# Patient Record
Sex: Male | Born: 1990 | Marital: Single | Smoking: Never smoker
Health system: Southern US, Community
[De-identification: ages and names within clinical notes are randomized; demographics above are authoritative.]

---
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
## 2018-01-23 ENCOUNTER — Emergency Department (HOSPITAL_COMMUNITY): Payer: PRIVATE HEALTH INSURANCE

## 2018-01-23 ENCOUNTER — Emergency Department (HOSPITAL_COMMUNITY)
Admission: EM | Admit: 2018-01-23 | Discharge: 2018-01-24 | Disposition: A | Discharge status: Home / Self Care | Payer: PRIVATE HEALTH INSURANCE | Attending: Emergency Medicine | Admitting: Emergency Medicine

## 2018-01-23 ENCOUNTER — Encounter (HOSPITAL_COMMUNITY): Payer: Self-pay | Admitting: Emergency Medicine

## 2018-01-23 ENCOUNTER — Other Ambulatory Visit: Payer: Self-pay

## 2018-01-23 DIAGNOSIS — Z046 Encounter for general psychiatric examination, requested by authority: Secondary | ICD-10-CM | POA: Diagnosis present

## 2018-01-23 DIAGNOSIS — R454 Irritability and anger: Secondary | ICD-10-CM | POA: Insufficient documentation

## 2018-01-23 DIAGNOSIS — R451 Restlessness and agitation: Secondary | ICD-10-CM | POA: Diagnosis not present

## 2018-01-23 DIAGNOSIS — F23 Brief psychotic disorder: Secondary | ICD-10-CM | POA: Insufficient documentation

## 2018-01-23 LAB — COMPREHENSIVE METABOLIC PANEL
ALK PHOS: 43 U/L (ref 38–126)
ALT: 20 U/L (ref 0–44)
ANION GAP: 8 (ref 5–15)
AST: 21 U/L (ref 15–41)
Albumin: 4.5 g/dL (ref 3.5–5.0)
BUN: 18 mg/dL (ref 6–20)
CALCIUM: 9.4 mg/dL (ref 8.9–10.3)
CO2: 25 mmol/L (ref 22–32)
Chloride: 107 mmol/L (ref 98–111)
Creatinine, Ser: 1.27 mg/dL — ABNORMAL HIGH (ref 0.61–1.24)
GFR calc Af Amer: >60 mL/min (ref >60)
GFR calc non Af Amer: >60 mL/min (ref >60)
GLUCOSE: 105 mg/dL — AB (ref 70–99)
Potassium: 4.1 mmol/L (ref 3.5–5.1)
SODIUM: 140 mmol/L (ref 135–145)
Total Bilirubin: 1.1 mg/dL (ref 0.3–1.2)
Total Protein: 7.5 g/dL (ref 6.5–8.1)

## 2018-01-23 LAB — TSH: TSH: 2.287 u[IU]/mL (ref 0.350–4.500)

## 2018-01-23 LAB — CBC
HCT: 45.9 % (ref 39.0–52.0)
HEMOGLOBIN: 15.2 g/dL (ref 13.0–17.0)
MCH: 29.8 pg (ref 26.0–34.0)
MCHC: 33.1 g/dL (ref 30.0–36.0)
MCV: 90 fL (ref 78.0–100.0)
Platelets: 250 10*3/uL (ref 150–400)
RBC: 5.1 MIL/uL (ref 4.22–5.81)
RDW: 13 % (ref 11.5–15.5)
WBC: 11 10*3/uL — ABNORMAL HIGH (ref 4.0–10.5)

## 2018-01-23 LAB — ACETAMINOPHEN LEVEL

## 2018-01-23 LAB — ETHANOL: Alcohol, Ethyl (B): <10 mg/dL (ref <10)

## 2018-01-23 LAB — SALICYLATE LEVEL: Salicylate Lvl: <7 mg/dL (ref 2.8–30.0)

## 2018-01-23 MED ORDER — ACETAMINOPHEN 325 MG PO TABS: 650.0000 mg | ORAL_TABLET | Freq: Four times a day (QID) | ORAL | Status: DC | PRN

## 2018-01-23 MED ORDER — LORAZEPAM 2 MG/ML IJ SOLN: 1.0000 mg | INTRAMUSCULAR | Status: DC | PRN

## 2018-01-23 MED ORDER — LORAZEPAM 1 MG PO TABS
1.0000 mg | ORAL_TABLET | ORAL | Status: DC | PRN
  Administered 2018-01-23: 1 mg via ORAL
  Filled 2018-01-23: qty 1

## 2018-01-23 NOTE — ED Provider Notes (Signed)
Patient placed in Quick Look pathway, seen and evaluated   Chief Complaint: aggressive behavior  HPI:   27 year old male with no PMH presenting with aggressive behavior and worsening outbursts towards his family. Mother reports the patient has lost weight and has noticed his eyes appearing to be bulging. He has cuts on his bilateral wrists.  History is limited as the patient will not answer questions at this time. He is intermittently getting out of the chair and screaming at his mother asking to go home.  ROS: Aggression (one)  Physical Exam:   Gen: No distress  Neuro: Awake and Alert  Skin: Warm    Focused Exam: Cuts noted to the bilateral wrist.  Labile mood.  Appears agitated.  The patient was seen and evaluated by myself and Dr. Silverio LayYao, attending physician.  IVC paperwork was placed.  Initiation of care has begun. The patient has been counseled on the process, plan, and necessity for staying for the completion/evaluation, and the remainder of the medical screening examination    Barkley BoardsMcDonald, Luxe Cuadros A, PA-C 01/23/18 1752    Charlynne PanderYao, David Hsienta, MD 01/24/18 [phone]

## 2018-01-23 NOTE — ED Notes (Signed)
Mother left phone number for patients father Jesusita OkaDan [phone].
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
## 2018-01-23 NOTE — ED Notes (Addendum)
Pt talking to the dad stating he wants to get out of here and asking dad to get out of here, why can't you come to pick me up. Dad reforcing that he has to wait till tomorrow. RN told pt that he has to wait till the doctors come and talks to him in the morning. Pt understands and goes back to his room.
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
## 2018-01-23 NOTE — ED Notes (Signed)
Pt dinner tray ordered.

## 2018-01-23 NOTE — ED Notes (Signed)
Pt ambulated to restroom with out any problems. 

## 2018-01-23 NOTE — ED Notes (Signed)
Pt updated on how the process works and what will happen next. RN asks pt if he would like something to help him sleep tonight and pt states that he would take something that will help him sleep and calm down.
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
## 2018-01-23 NOTE — ED Notes (Addendum)
Pt states is he allowed to make phone calls, pt updated by RN that he can make two phone calls that are up to 5 minutes long per day. Pt placing a call at this time.

## 2018-01-24 ENCOUNTER — Encounter (HOSPITAL_COMMUNITY): Payer: Self-pay | Admitting: Registered Nurse

## 2018-01-24 ENCOUNTER — Other Ambulatory Visit: Payer: Self-pay

## 2018-01-24 LAB — RAPID URINE DRUG SCREEN, HOSP PERFORMED
Amphetamines: NOT DETECTED
Benzodiazepines: NOT DETECTED
COCAINE: NOT DETECTED
OPIATES: NOT DETECTED
TETRAHYDROCANNABINOL: POSITIVE — AB

## 2018-01-24 MED ORDER — LORAZEPAM 1 MG PO TABS: 1.0000 mg | ORAL_TABLET | Freq: Three times a day (TID) | ORAL | 0 refills | Status: DC | PRN

## 2018-01-24 MED ORDER — OLANZAPINE 5 MG PO TABS
5.0000 mg | ORAL_TABLET | Freq: Two times a day (BID) | ORAL | Status: DC
  Administered 2018-01-24: 5 mg via ORAL
  Filled 2018-01-24: qty 1

## 2018-01-24 NOTE — Progress Notes (Signed)
CSW asked to obtain collateral from pt's parents.  CSW spoke to patient's father, Darien Ramusdward Sylva, who reports that pt has had a history of increasing withdrawal from "the world", isolation, aggressive behavior since 4-5 years ago.  He is ot able to go to school or work because he can not concentrate.  Father stated that the "family is scared of him when he gets aggressive."  CSW explained that pt does not meet criteria and has been calm and cooperative for the past 24 hours.  CSW offered several suggestions to father, including limit setting that might include a requirement that he go to be seen by a psychiatrist on an outpatient basis, having patient arrested if he continues to act aggressively and/or is assaultive in the future.  Father very reluctant to have pt arrested and related that they have been attempting to get him to go to a psychiatrist "for years".  Father did agree to pick patient up but is not able to do so until later on this afternoon, after 6 PM when he gets out of work.   CSW fax[facility]  Timmothy EulerJean T. Kaylyn LimSutter, MSW, LCSWA Disposition Clinical Social Work [phone] (cell) [phone] (office)

## 2018-01-24 NOTE — ED Notes (Signed)
Deputy served [employer] papers - [facility] copy sent to [facility], and all 3 sets on clipboard.

## 2018-01-24 NOTE — ED Notes (Signed)
Pt aware waiting for his father to pick him up this evening. Voiced understanding.

## 2018-01-24 NOTE — ED Notes (Signed)
Pt's father's number - [phone]- [phone].
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
## 2018-01-24 NOTE — ED Notes (Signed)
Pt noted to be wearing burgundy paper scrubs and has slide shoes at bedside.

## 2018-01-24 NOTE — ED Notes (Signed)
[facility] Carney BernJean, [facility], pt's father advised will pick up pt after work - around 1800 this evening.

## 2018-01-24 NOTE — ED Notes (Signed)
Breakfast tray ordered 

## 2018-01-24 NOTE — ED Notes (Signed)
[facility] [facility] to send [employer] to re-serve [employer] papers as requested

## 2018-01-24 NOTE — ED Notes (Signed)
Telepsych being performed. 

## 2018-01-24 NOTE — ED Notes (Signed)
Patient was given a drink. 

## 2018-01-24 NOTE — Progress Notes (Signed)
Disposition CSW called parents per patient's verbal permission and parents' request.  Left HIPAA compliant voice mail.  Timmothy EulerJean T. Kaylyn LimSutter, MSW, LCSWA Disposition Clinical Social Work [phone] (cell) [phone] (office)

## 2018-01-24 NOTE — ED Notes (Signed)
Rescinded IVC paperwork faxed and received at 1930 by [facility],RN

## 2018-01-24 NOTE — ED Notes (Signed)
Regular Diet was ordered for lunch.

## 2018-01-24 NOTE — ED Notes (Signed)
Pt noted to be laughing inappropriately at times - is currently sleeping. Lunch tray on bedside table.

## 2018-01-24 NOTE — ED Notes (Signed)
Pt ate 100% of breakfast - [facility] advised will be performing Telepsych soon.

## 2018-01-24 NOTE — ED Notes (Signed)
Regular diet ordered for dinner 

## 2018-01-24 NOTE — ED Notes (Signed)
Pt awake now - aware his mother called checking on him - pt gave verbal permission to give mother info. Mother had [facility] and pt's father - [phone] - prior to disposition.

## 2018-09-01 DIAGNOSIS — F203 Undifferentiated schizophrenia: Secondary | ICD-10-CM | POA: Diagnosis present

## 2019-08-04 ENCOUNTER — Encounter (HOSPITAL_COMMUNITY): Payer: Self-pay | Admitting: Emergency Medicine

## 2019-08-04 ENCOUNTER — Emergency Department (HOSPITAL_COMMUNITY)
Admission: EM | Admit: 2019-08-04 | Discharge: 2019-08-05 | Disposition: A | Discharge status: Home / Self Care | Payer: PRIVATE HEALTH INSURANCE | Attending: Emergency Medicine | Admitting: Emergency Medicine

## 2019-08-04 ENCOUNTER — Ambulatory Visit (HOSPITAL_COMMUNITY)
Admission: RE | Admit: 2019-08-04 | Discharge: 2019-08-04 | Disposition: A | Discharge status: Home / Self Care | Payer: Self-pay | Attending: Psychiatry | Admitting: Psychiatry

## 2019-08-04 ENCOUNTER — Other Ambulatory Visit: Payer: Self-pay

## 2019-08-04 DIAGNOSIS — R454 Irritability and anger: Secondary | ICD-10-CM | POA: Insufficient documentation

## 2019-08-04 DIAGNOSIS — Z915 Personal history of self-harm: Secondary | ICD-10-CM | POA: Insufficient documentation

## 2019-08-04 DIAGNOSIS — F12959 Cannabis use, unspecified with psychotic disorder, unspecified: Secondary | ICD-10-CM | POA: Insufficient documentation

## 2019-08-04 DIAGNOSIS — Z20822 Contact with and (suspected) exposure to covid-19: Secondary | ICD-10-CM | POA: Insufficient documentation

## 2019-08-04 DIAGNOSIS — F29 Unspecified psychosis not due to a substance or known physiological condition: Secondary | ICD-10-CM | POA: Insufficient documentation

## 2019-08-04 DIAGNOSIS — F2081 Schizophreniform disorder: Secondary | ICD-10-CM | POA: Insufficient documentation

## 2019-08-04 DIAGNOSIS — R443 Hallucinations, unspecified: Secondary | ICD-10-CM | POA: Insufficient documentation

## 2019-08-04 LAB — CBC
HCT: 44 % (ref 39.0–52.0)
Hemoglobin: 14.8 g/dL (ref 13.0–17.0)
MCH: 30.6 pg (ref 26.0–34.0)
MCHC: 33.6 g/dL (ref 30.0–36.0)
MCV: 90.9 fL (ref 80.0–100.0)
Platelets: 242 10*3/uL (ref 150–400)
RBC: 4.84 MIL/uL (ref 4.22–5.81)
RDW: 13.1 % (ref 11.5–15.5)
WBC: 9.5 10*3/uL (ref 4.0–10.5)
nRBC: 0 % (ref 0.0–0.2)

## 2019-08-04 LAB — COMPREHENSIVE METABOLIC PANEL
ALT: 17 U/L (ref 0–44)
AST: 18 U/L (ref 15–41)
Albumin: 4.6 g/dL (ref 3.5–5.0)
Alkaline Phosphatase: 42 U/L (ref 38–126)
Anion gap: 7 (ref 5–15)
BUN: 18 mg/dL (ref 6–20)
CO2: 24 mmol/L (ref 22–32)
Calcium: 8.9 mg/dL (ref 8.9–10.3)
Chloride: 108 mmol/L (ref 98–111)
Creatinine, Ser: 1.23 mg/dL (ref 0.61–1.24)
GFR calc Af Amer: >60 mL/min (ref >60)
GFR calc non Af Amer: >60 mL/min (ref >60)
Glucose, Bld: 103 mg/dL — ABNORMAL HIGH (ref 70–99)
Potassium: 3.9 mmol/L (ref 3.5–5.1)
Sodium: 139 mmol/L (ref 135–145)
Total Bilirubin: 1.1 mg/dL (ref 0.3–1.2)
Total Protein: 7.4 g/dL (ref 6.5–8.1)

## 2019-08-04 LAB — SALICYLATE LEVEL: Salicylate Lvl: <7 mg/dL — ABNORMAL LOW (ref 7.0–30.0)

## 2019-08-04 LAB — ETHANOL: Alcohol, Ethyl (B): <10 mg/dL (ref <10)

## 2019-08-04 LAB — ACETAMINOPHEN LEVEL: Acetaminophen (Tylenol), Serum: <10 ug/mL — ABNORMAL LOW (ref 10–30)
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
## 2019-08-04 NOTE — BH Assessment (Signed)
[facility] Assessment Progress Note  Case was staffed with Anselm Jungling FNP who recommended patient be observed and monitored. Patient will be transported to [facility] for medical clearance. Observation nurse to contact law enforcement to transport as [facility] charge nurse will be notified.
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
## 2019-08-05 DIAGNOSIS — F2081 Schizophreniform disorder: Secondary | ICD-10-CM

## 2019-08-05 DIAGNOSIS — F12959 Cannabis use, unspecified with psychotic disorder, unspecified: Secondary | ICD-10-CM | POA: Diagnosis present

## 2019-08-05 LAB — RAPID URINE DRUG SCREEN, HOSP PERFORMED
Amphetamines: NOT DETECTED
Barbiturates: NOT DETECTED
Benzodiazepines: NOT DETECTED
Cocaine: NOT DETECTED
Opiates: NOT DETECTED
Tetrahydrocannabinol: POSITIVE — AB

## 2019-08-05 LAB — SARS CORONAVIRUS 2 (TAT 6-24 HRS): SARS Coronavirus 2: NEGATIVE

## 2019-08-05 NOTE — ED Provider Notes (Signed)
Emergency Medicine Observation Re-evaluation Note  Nathan Mayo is a 29 y.o. male, seen on rounds today.  Pt initially presented to the ED for complaints of IVC and Hallucinations Currently, the patient is stable.  Physical Exam  BP 108/71 (BP Location: Right Arm)   Pulse (!) 56   Temp 97.8 F (36.6 C) (Oral)   Resp 18   Ht 6' (1.829 m)   Wt 72.6 kg   SpO2 100%   BMI 21.70 kg/m  Physical Exam  ED Course / MDM  EKG:    I have reviewed the labs performed to date as well as medications administered while in observation.  Recent changes in the last 24 hours include TTS consult. Plan  Current plan is for for discharge. Patient is under full IVC at this time. Will rescind   Terrilee Files, MD 08/05/19 1054
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
## 2019-11-30 IMAGING — CT CT HEAD W/O CM
3 of 4 series · 16 of 47 positions shown, 19 images · non-contrast
Comparison: None.

CLINICAL DATA: Agitation, aggressive behavior and self injury.

EXAM:
CT HEAD WITHOUT CONTRAST
TECHNIQUE: Contiguous axial images were obtained from the base of the skull
through the vertex without intravenous contrast.

[Series 4: head 2.0 h70h · axial · 0.42mm/px · z∈[-84,+66]mm · 10 of 85 slices shown, 13 images]
[im 5/85  brain]
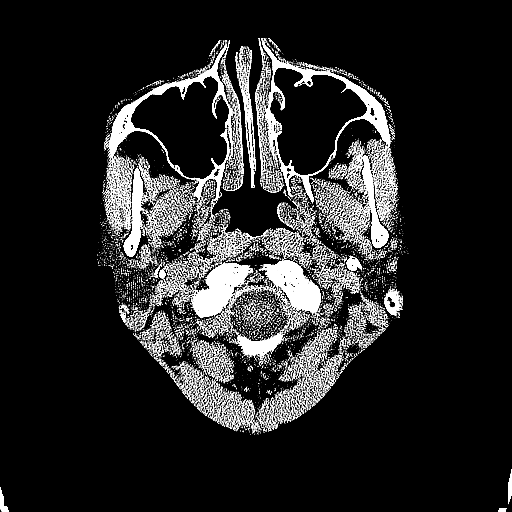
[im 5/85  bone]
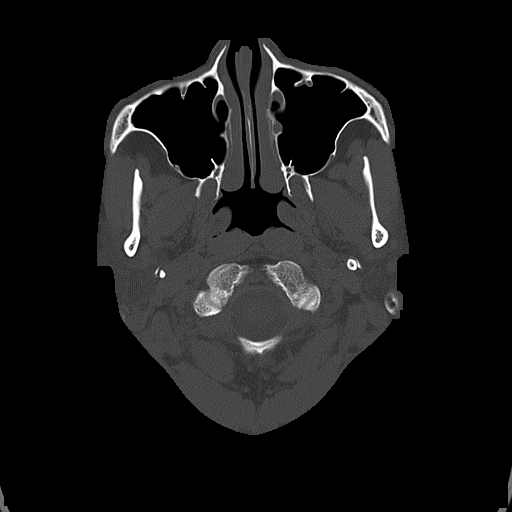
[im 13/85  brain]
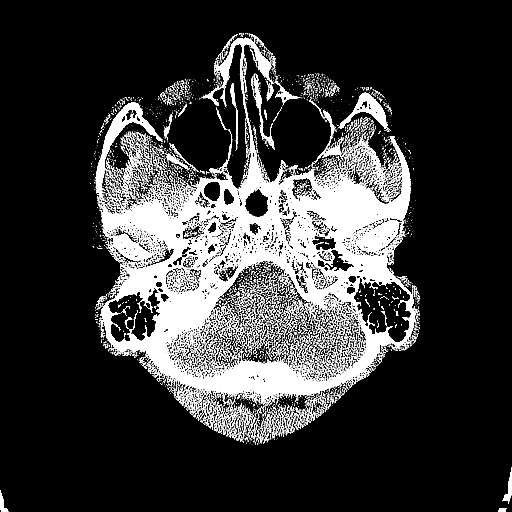
[im 22/85  brain]
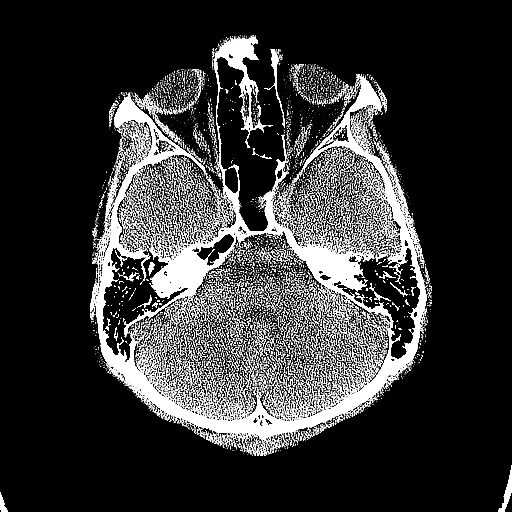
[im 30/85  brain]
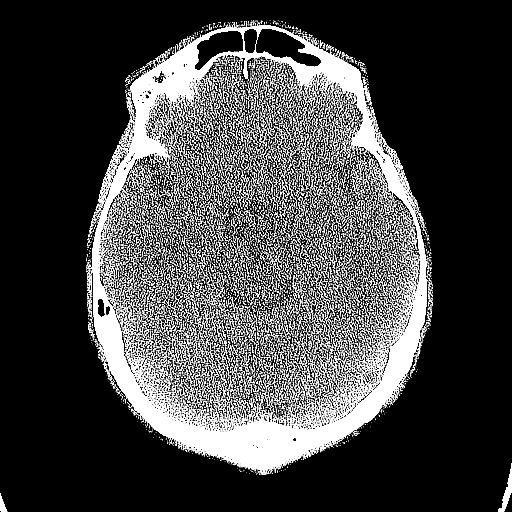
[im 38/85  brain]
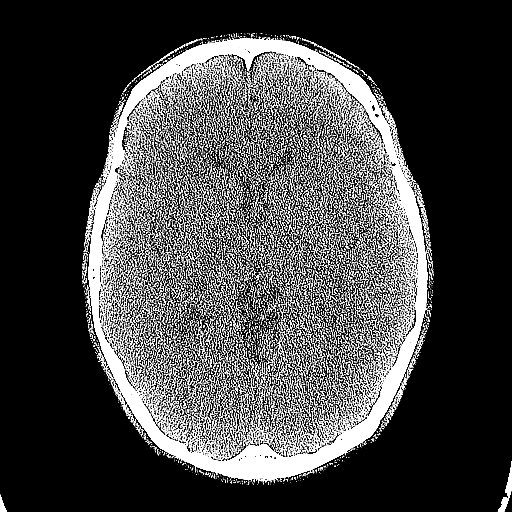
[im 38/85  bone]
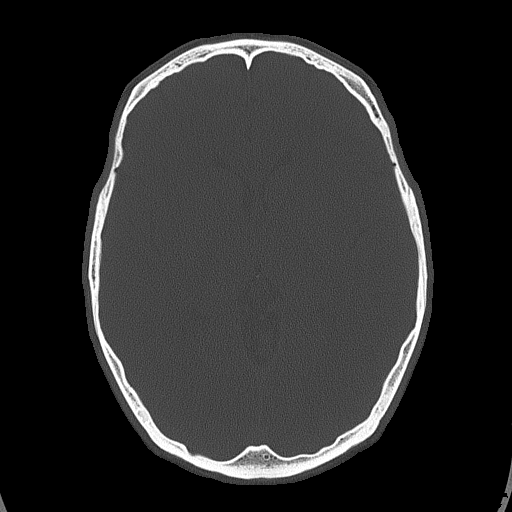
[im 47/85  brain]
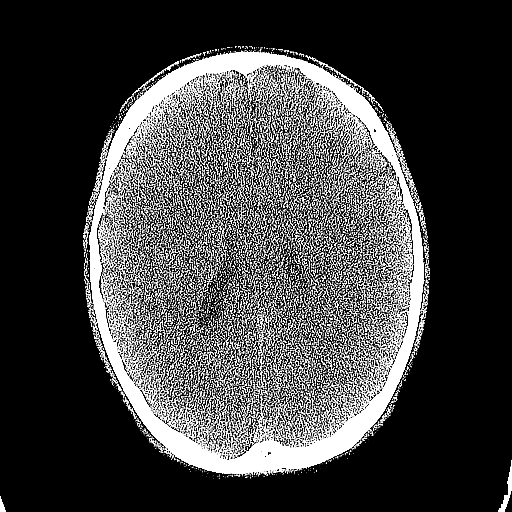
[im 55/85  brain]
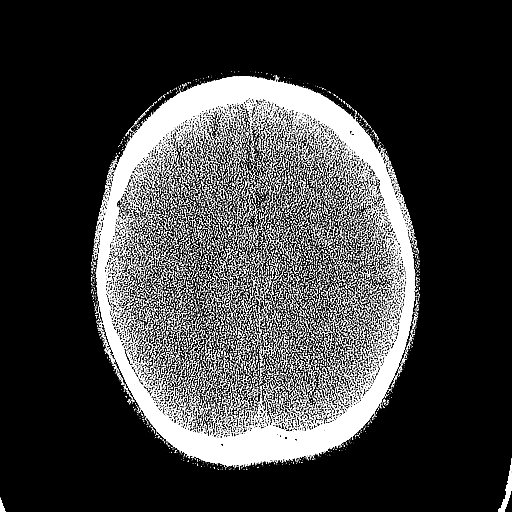
[im 64/85  brain]
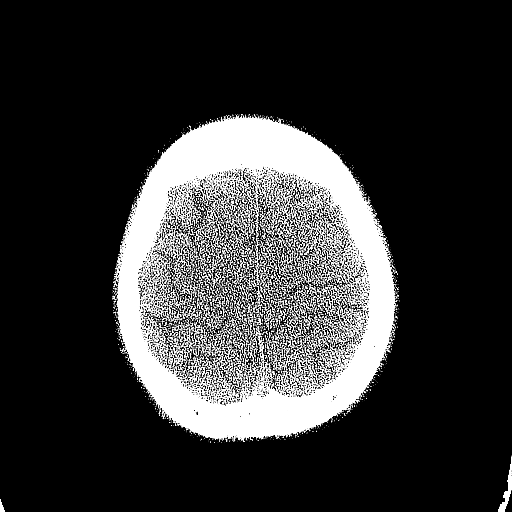
[im 72/85  brain]
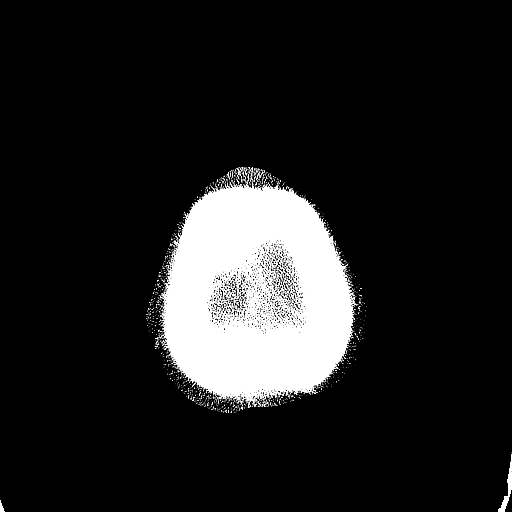
[im 72/85  bone]
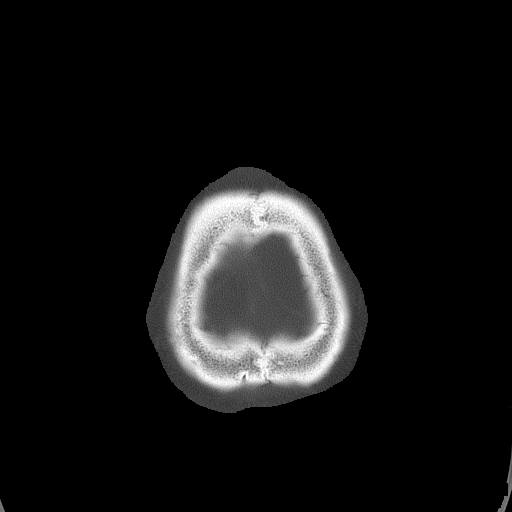
[im 80/85  brain]
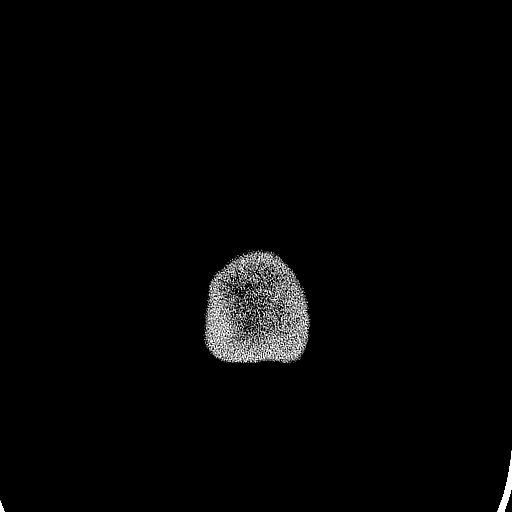

[Series 5: head 3.0 mpr cor · coronal · 0.33mm/px · 3 of 73 slices shown]
[im 25/73  brain]
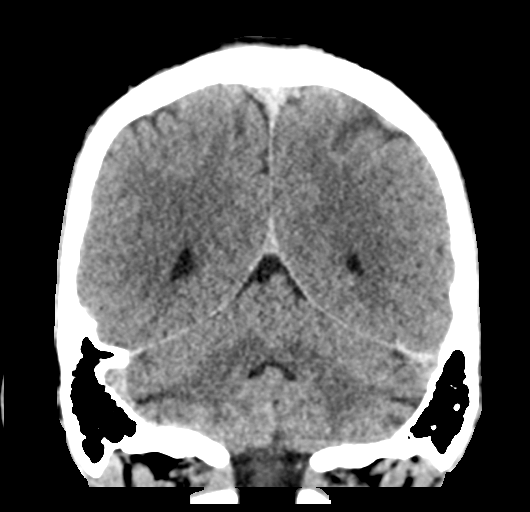
[im 33/73  brain]
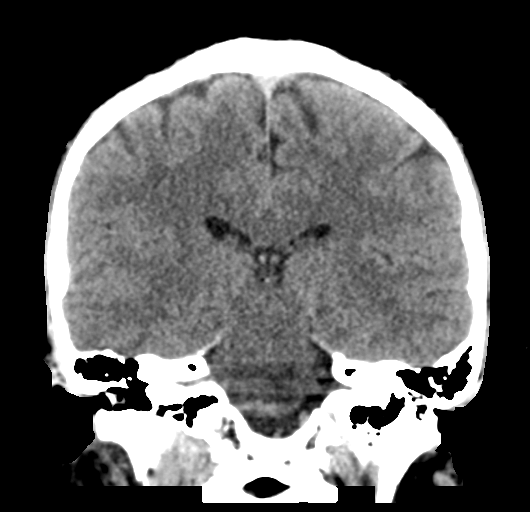
[im 41/73  brain]
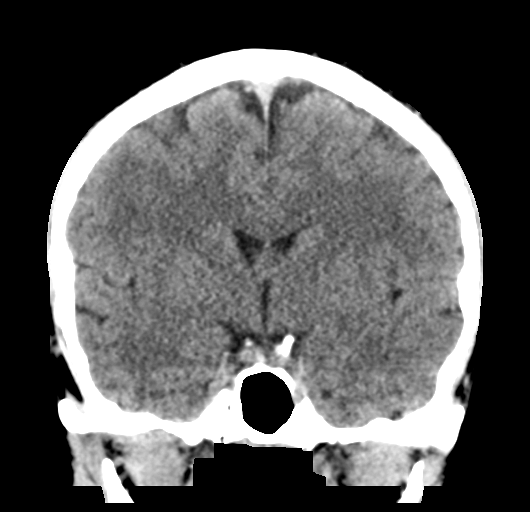

[Series 6: head 3.0 mpr sag · sagittal · 0.33mm/px · 3 of 66 slices shown]
[im 22/66  brain]
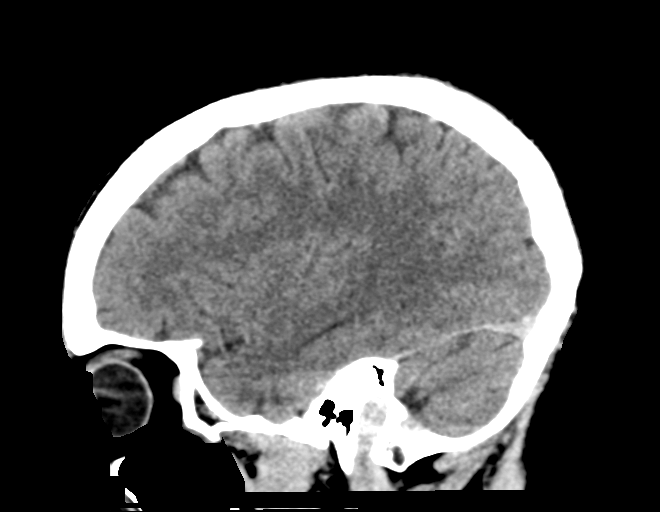
[im 33/66  brain]
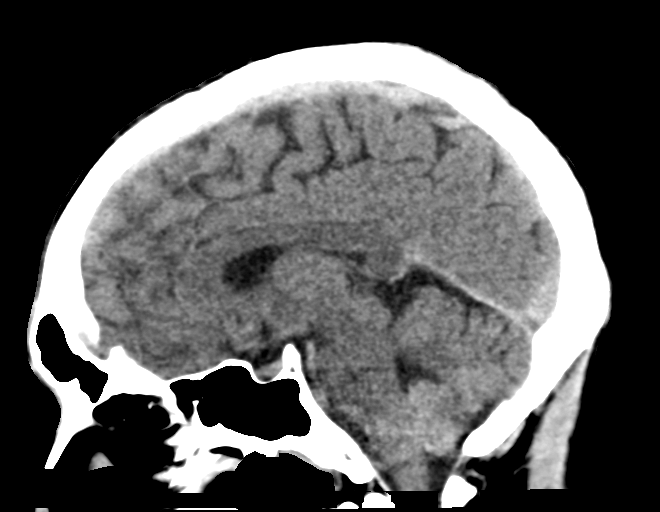
[im 44/66  brain]
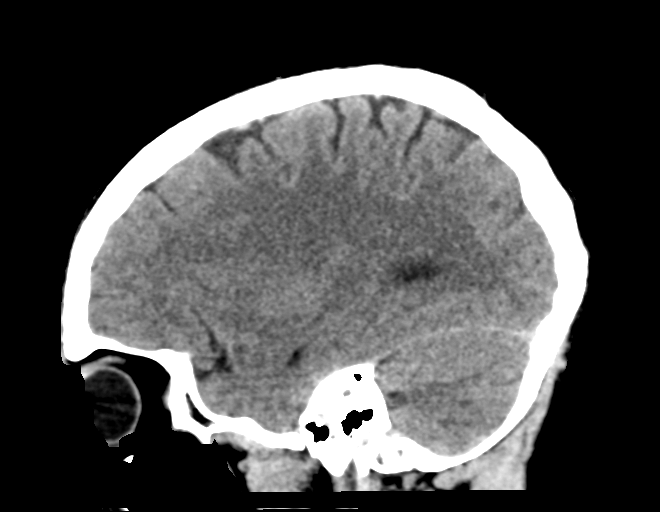

[16 of 47 positions shown; findings below may reference images not displayed]

FINDINGS: Brain: No evidence of acute infarction, hemorrhage, hydrocephalus,
extra-axial collection or mass lesion/mass effect.

Vascular: No hyperdense vessel or unexpected calcification.

Skull: Normal. Negative for fracture or focal lesion.

Sinuses/Orbits: No acute finding.

Other: None.
IMPRESSION: Normal head CT.

## 2021-11-28 ENCOUNTER — Other Ambulatory Visit: Payer: Self-pay

## 2021-11-28 ENCOUNTER — Emergency Department (HOSPITAL_COMMUNITY)
Admission: EM | Admit: 2021-11-28 | Discharge: 2021-12-09 | Disposition: A | Discharge status: Other Acute Inpatient Hospital | Payer: Self-pay | Attending: Student | Admitting: Student

## 2021-11-28 ENCOUNTER — Encounter (HOSPITAL_COMMUNITY): Payer: Self-pay | Admitting: *Deleted

## 2021-11-28 DIAGNOSIS — Y900 Blood alcohol level of less than 20 mg/100 ml: Secondary | ICD-10-CM | POA: Insufficient documentation

## 2021-11-28 DIAGNOSIS — Z20822 Contact with and (suspected) exposure to covid-19: Secondary | ICD-10-CM | POA: Insufficient documentation

## 2021-11-28 DIAGNOSIS — R45850 Homicidal ideations: Secondary | ICD-10-CM | POA: Insufficient documentation

## 2021-11-28 DIAGNOSIS — F2081 Schizophreniform disorder: Secondary | ICD-10-CM | POA: Diagnosis present

## 2021-11-28 DIAGNOSIS — R443 Hallucinations, unspecified: Secondary | ICD-10-CM

## 2021-11-28 DIAGNOSIS — E876 Hypokalemia: Secondary | ICD-10-CM | POA: Insufficient documentation

## 2021-11-28 DIAGNOSIS — F29 Unspecified psychosis not due to a substance or known physiological condition: Secondary | ICD-10-CM | POA: Insufficient documentation

## 2021-11-28 LAB — CBC WITH DIFFERENTIAL/PLATELET
Abs Immature Granulocytes: 0.02 10*3/uL (ref 0.00–0.07)
Basophils Absolute: 0 10*3/uL (ref 0.0–0.1)
Basophils Relative: 0 %
Eosinophils Absolute: 0.1 10*3/uL (ref 0.0–0.5)
Eosinophils Relative: 1 %
HCT: 45.7 % (ref 39.0–52.0)
Hemoglobin: 15.8 g/dL (ref 13.0–17.0)
Immature Granulocytes: 0 %
Lymphocytes Relative: 15 %
Lymphs Abs: 1.5 10*3/uL (ref 0.7–4.0)
MCH: 30.7 pg (ref 26.0–34.0)
MCHC: 34.6 g/dL (ref 30.0–36.0)
MCV: 88.7 fL (ref 80.0–100.0)
Monocytes Absolute: 0.6 10*3/uL (ref 0.1–1.0)
Monocytes Relative: 6 %
Neutro Abs: 7.7 10*3/uL (ref 1.7–7.7)
Neutrophils Relative %: 78 %
Platelets: 279 10*3/uL (ref 150–400)
RBC: 5.15 MIL/uL (ref 4.22–5.81)
RDW: 12.6 % (ref 11.5–15.5)
WBC: 9.9 10*3/uL (ref 4.0–10.5)
nRBC: 0 % (ref 0.0–0.2)

## 2021-11-28 LAB — COMPREHENSIVE METABOLIC PANEL
ALT: 15 U/L (ref 0–44)
AST: 14 U/L — ABNORMAL LOW (ref 15–41)
Albumin: 4.8 g/dL (ref 3.5–5.0)
Alkaline Phosphatase: 46 U/L (ref 38–126)
Anion gap: 6 (ref 5–15)
BUN: 16 mg/dL (ref 6–20)
CO2: 24 mmol/L (ref 22–32)
Calcium: 9.1 mg/dL (ref 8.9–10.3)
Chloride: 108 mmol/L (ref 98–111)
Creatinine, Ser: 1.16 mg/dL (ref 0.61–1.24)
GFR, Estimated: >60 mL/min (ref >60)
Glucose, Bld: 130 mg/dL — ABNORMAL HIGH (ref 70–99)
Potassium: 3.3 mmol/L — ABNORMAL LOW (ref 3.5–5.1)
Sodium: 138 mmol/L (ref 135–145)
Total Bilirubin: 1.3 mg/dL — ABNORMAL HIGH (ref 0.3–1.2)
Total Protein: 8 g/dL (ref 6.5–8.1)

## 2021-11-28 LAB — SALICYLATE LEVEL: Salicylate Lvl: <7 mg/dL — ABNORMAL LOW (ref 7.0–30.0)

## 2021-11-28 LAB — ETHANOL: Alcohol, Ethyl (B): <10 mg/dL (ref <10)

## 2021-11-28 LAB — ACETAMINOPHEN LEVEL: Acetaminophen (Tylenol), Serum: <10 ug/mL — ABNORMAL LOW (ref 10–30)
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
## 2021-11-28 NOTE — BH Assessment (Addendum)
This writer attempted to assess patient unsuccessfully due to problems with tele-med cart volume. Cart was re-booted and nurse who was assisting stated volume on cart is at 100 percent. Patient still could not hear this Clinical research associate. Volume was working correctly on the desk top at this location. IT was contacted at 1630 and ticket has been put in as a priority.   Number: [id number] Description: Volume unavailable on a specific workstation Opened: 2021-11-28 16:33:39 EDT
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
## 2021-11-28 NOTE — ED Notes (Signed)
Patient changed into paper scrubs and wanded by security. One labeled patient belongings bag secured in triage nurse's station.

## 2021-11-28 NOTE — ED Notes (Signed)
TTS at bedside. 

## 2021-11-29 DIAGNOSIS — F29 Unspecified psychosis not due to a substance or known physiological condition: Secondary | ICD-10-CM | POA: Diagnosis present

## 2021-11-29 LAB — RESP PANEL BY RT-PCR (FLU A&B, COVID) ARPGX2
Influenza A by PCR: NEGATIVE
Influenza B by PCR: NEGATIVE
SARS Coronavirus 2 by RT PCR: NEGATIVE

## 2021-11-29 LAB — RAPID URINE DRUG SCREEN, HOSP PERFORMED
Amphetamines: NOT DETECTED
Barbiturates: NOT DETECTED
Benzodiazepines: NOT DETECTED
Cocaine: NOT DETECTED
Opiates: NOT DETECTED
Tetrahydrocannabinol: POSITIVE — AB

## 2021-11-29 MED ORDER — OLANZAPINE 10 MG PO TBDP
10.0000 mg | ORAL_TABLET | Freq: Every day | ORAL | Status: DC
Start: 2021-11-29 — End: 2021-11-30
  Administered 2021-11-29: 10 mg via ORAL
  Filled 2021-11-29 (×2): qty 1

## 2021-11-29 MED ORDER — ZIPRASIDONE MESYLATE 20 MG IM SOLR
20.0000 mg | INTRAMUSCULAR | Status: DC | PRN
  Filled 2021-11-29: qty 20

## 2021-11-29 MED ORDER — OLANZAPINE 10 MG PO TBDP
10.0000 mg | ORAL_TABLET | Freq: Three times a day (TID) | ORAL | Status: DC | PRN
  Administered 2021-12-01 – 2021-12-04 (×2): 10 mg via ORAL
  Filled 2021-11-29 (×2): qty 1

## 2021-11-29 MED ORDER — LORAZEPAM 1 MG PO TABS
1.0000 mg | ORAL_TABLET | ORAL | Status: AC | PRN
  Administered 2021-11-29: 1 mg via ORAL
  Filled 2021-11-29: qty 1

## 2021-11-29 NOTE — Progress Notes (Signed)
Inpatient Behavioral Health Placement   Pt meets inpatient criteria per Leandro Reasoner, NP,. There are no available beds at [facility] per [facility] Hazard, RN.   Referral was sent to the following facilities;    Destination Service Provider Address Phone [facility]  [address] [place] [address] [phone] [facility]  [address] [place] [address] [phone] [facility]  [place], [place] [address] [place]  [facility] [address] [place] [address] [phone] [facility]  [address] [place] [address] [phone]  [facility]  [address] [place] [address] [phone]  [facility]  [phone] [address] [place] [address] [phone]  [facility]  [address], [place] [address] [phone]  [facility]  [address] [place] [address] [phone] [facility]  [address], [place] [address] [phone]  [facility]  [address] [place] [address] [phone] [place]  [address] [place] [address] [place]  [facility]  [address], [place] [address] [phone]  [facility]  [address], [place] [address] [phone]   Situation ongoing,  CSW will follow up.   Benjaman Kindler, MSW, LCSWA 11/29/2021  @ 1:59 AM

## 2021-11-29 NOTE — ED Provider Notes (Signed)
10:20 PM-nursing contacted me because they were concerned about his heart rate "in the 40s."  He is currently asleep.  Patient has had similar low heart rate, last night at 8:56 PM.  He has been seen and medically cleared.  Psychiatry is evaluating him for psychiatric illness with substance abuse.  He is reported to be under IVC.  Patient has been paranoid and delusional here.  Psychiatry plans on admitting him to an inpatient unit.  He does not appear to be taking any medications, which would lower the heart rate.  Medical screening exam indicated potassium slightly low at 3.3.  It is unlikely that that is causing bradycardia.  At this point we will have nursing check orthostatics after he awakes, and monitor his vital signs every 2 hours while awake.  Will order repeat potassium test for tomorrow morning.   Mancel Bale, MD 11/29/21 2229
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
## 2021-11-29 NOTE — ED Notes (Signed)
Patient currently asleep in bed

## 2021-11-29 NOTE — ED Notes (Signed)
Patient resting in bed responding to internal stimuli.

## 2021-11-29 NOTE — ED Notes (Signed)
Requested urine sample from pt, he stated that someone already took him to the bathroom.

## 2021-11-29 NOTE — ED Notes (Signed)
TTS at bedside. 

## 2021-11-29 NOTE — ED Notes (Signed)
Patient currently asleep.

## 2021-11-29 NOTE — ED Provider Notes (Signed)
Emergency Medicine Observation Re-evaluation Note  Nathan Mayo is a 31 y.o. male, seen on rounds today.  Pt initially presented to the ED for complaints of Behavioral Concerns and Hallucinations Currently, the patient is resting.  Physical Exam  BP 115/89 (BP Location: Left Arm)   Pulse (!) 56   Temp 97.8 F (36.6 C) (Oral)   Resp 18   Ht 6' (1.829 m)   Wt 72.6 kg   SpO2 100%   BMI 21.70 kg/m  Physical Exam General: calm Cardiac: warm well perfused Lungs: even unlabored Psych: calm  ED Course / MDM  EKG:   I have reviewed the labs performed to date as well as medications administered while in observation.  Recent changes in the last 24 hours include psych rec in pt.  Plan  Current plan is for in pt placement for psych.  Nathan Mayo is under involuntary commitment.     Milagros Loll, MD 11/29/21 [phone]
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
## 2021-11-29 NOTE — Consult Note (Signed)
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 male patient admitted with hallucination.  Patient presents alert and oriented to person, place; bizarre and cooperative. Frequent pauses when speaking; moderate thought blocking noted. "I was trying to get some nicotine from tobacco store and they wouldn't take me so they called the cops". Inconsistent eye contact. Multiple facial gestures. Laughs inappropriately. Constantly looks around room and down attempting to conceal face. States he is currently unemployed; says he was previously employed by [employer] about 2 months ago. He denies current outpatient services including medication management.  He denies any thoughts of wanting to himself or others including his parents. Appears to be inconsistent historian. Denies any auditory or visual hallucinations; is actively responding to external/internal stimuli. He denies any illicit substance use; UDS remains outstanding.   HPI:  Nathan Mayo is a 31 year old male patient with past psychiatric history of schizophreniform, aggressive behavior, cannabis-induced psychotic disorder who presented to [facility] by police under IVC after patient was "setting to kill his parents". Officers report patient was responding to internal/external stimuli while with them. UDS is outstanding, BAL<10.   Past Psychiatric History: schizophreniform, cannabis induced psychotic disorder  Risk to Self:  yes Risk to Others:  yes Prior Inpatient Therapy:  yes Prior Outpatient  Therapy:  yes  Past Medical History: History reviewed. No pertinent past medical history. History reviewed. No pertinent surgical history. Family History: No family history on file. Family Psychiatric  History: not noted Social History:  Social History   Substance and Sexual Activity  Alcohol Use No     Social History   Substance and Sexual Activity  Drug Use No    Social History   Socioeconomic History   Marital status: Single    Spouse name: Not on file   Number of children: Not on file   Years of education: Not on file   Highest education level: Not on file  Occupational History   Not on file  Tobacco Use   Smoking status: Never   Smokeless tobacco: Never  Vaping Use   Vaping Use: Every day  Substance and Sexual Activity   Alcohol use: No   Drug use: No   Sexual activity: Never  Other Topics Concern   Not on file  Social History Narrative   Not on file   Social Determinants of Health   Financial Resource Strain: Not on file  Food Insecurity: Not on file  Transportation Needs: Not on file  Physical Activity: Not on file  Stress: Not on file  Social Connections: Not on file   Additional Social History:    Allergies:  No Known Allergies  Labs:  Results for orders placed or performed during the hospital encounter of 11/28/21 (from the past 48 hour(s))  Comprehensive metabolic panel     Status: Abnormal   Collection Time: 11/28/21  3:31 PM  Result Value Ref Range   Sodium 138 135 - 145 mmol/L   Potassium 3.3 (L) 3.5 - 5.1 mmol/L   Chloride 108 98 - 111 mmol/L   CO2 24 22 -  32 mmol/L   Glucose, Bld 130 (H) 70 - 99 mg/dL    Comment: Glucose reference range applies only to samples taken after fasting for at least 8 hours.   BUN 16 6 - 20 mg/dL   Creatinine, Ser 1.16 0.61 - 1.24 mg/dL   Calcium 9.1 8.9 - 10.3 mg/dL   Total Protein 8.0 6.5 - 8.1 g/dL   Albumin 4.8 3.5 - 5.0 g/dL   AST 14 (L) 15 - 41 U/L   ALT 15 0 - 44 U/L   Alkaline Phosphatase 46 38  - 126 U/L   Total Bilirubin 1.3 (H) 0.3 - 1.2 mg/dL   GFR, Estimated >60 >60 mL/min    Comment: (NOTE) Calculated using the CKD-EPI Creatinine Equation (2021)    Anion gap 6 5 - 15    Comment: Performed at [facility], [place] [address] [place], [place] [address]  Ethanol     Status: None   Collection Time: 11/28/21  3:31 PM  Result Value Ref Range   Alcohol, Ethyl (B) <10 <10 mg/dL    Comment: (NOTE) Lowest detectable limit for serum alcohol is 10 mg/dL.  For medical purposes only. Performed at [facility], [place] [address] [place], [place] [address]   CBC with Diff     Status: None   Collection Time: 11/28/21  3:31 PM  Result Value Ref Range   WBC 9.9 4.0 - 10.5 K/uL   RBC 5.15 4.22 - 5.81 MIL/uL   Hemoglobin 15.8 13.0 - 17.0 g/dL   HCT 45.7 39.0 - 52.0 %   MCV 88.7 80.0 - 100.0 fL   MCH 30.7 26.0 - 34.0 pg   MCHC 34.6 30.0 - 36.0 g/dL   RDW 12.6 11.5 - 15.5 %   Platelets 279 150 - 400 K/uL   nRBC 0.0 0.0 - 0.2 %   Neutrophils Relative % 78 %   Neutro Abs 7.7 1.7 - 7.7 K/uL   Lymphocytes Relative 15 %   Lymphs Abs 1.5 0.7 - 4.0 K/uL   Monocytes Relative 6 %   Monocytes Absolute 0.6 0.1 - 1.0 K/uL   Eosinophils Relative 1 %   Eosinophils Absolute 0.1 0.0 - 0.5 K/uL   Basophils Relative 0 %   Basophils Absolute 0.0 0.0 - 0.1 K/uL   Immature Granulocytes 0 %   Abs Immature Granulocytes 0.02 0.00 - 0.07 K/uL    Comment: Performed at [facility], [place] [address], [place], [place] [address]  Acetaminophen level     Status: Abnormal   Collection Time: 11/28/21  3:31 PM  Result Value Ref Range   Acetaminophen (Tylenol), Serum <10 (L) 10 - 30 ug/mL    Comment: (NOTE) Therapeutic concentrations vary significantly. A range of 10-30 ug/mL  may be an effective concentration for many patients. However, some  are best treated at concentrations outside of this range. Acetaminophen concentrations >150 ug/mL at 4 hours after  ingestion  and >50 ug/mL at 12 hours after ingestion are often associated with  toxic reactions.  Performed at [facility], [place] [address] [place], [place] [id number]   Salicylate level     Status: Abnormal   Collection Time: 11/28/21  3:31 PM  Result Value Ref Range   Salicylate Lvl Q000111Q (L) 7.0 - 30.0 mg/dL    Comment: Performed at [facility], [place] [address] [place], [place] [address]  Resp Panel by RT-PCR (Flu A&B, Covid) Nasopharyngeal Swab  Status: None   Collection Time: 11/29/21  6:18 AM   Specimen: Nasopharyngeal Swab; Nasopharyngeal(NP) swabs in vial transport medium  Result Value Ref Range   SARS Coronavirus 2 by RT PCR NEGATIVE NEGATIVE    Comment: (NOTE) SARS-CoV-2 target nucleic acids are NOT DETECTED.  The SARS-CoV-2 RNA is generally detectable in upper respiratory specimens during the acute phase of infection. The lowest concentration of SARS-CoV-2 viral copies this assay can detect is 138 copies/mL. A negative result does not preclude SARS-Cov-2 infection and should not be used as the sole basis for treatment or other patient management decisions. A negative result may occur with  improper specimen collection/handling, submission of specimen other than nasopharyngeal swab, presence of viral mutation(s) within the areas targeted by this assay, and inadequate number of viral copies(<138 copies/mL). A negative result must be combined with clinical observations, patient history, and epidemiological information. The expected result is Negative.  Fact Sheet for Patients:  EntrepreneurPulse.com.au  Fact Sheet for Healthcare Providers:  IncredibleEmployment.be  This test is no t yet approved or cleared by the [place] FDA and  has been authorized for detection and/or diagnosis of SARS-CoV-2 by FDA under an Emergency Use Authorization (EUA). This EUA will remain  in effect (meaning  this test can be used) for the duration of the COVID-19 declaration under Section 564(b)(1) of the Act, 21 U.S.C.section 360bbb-3(b)(1), unless the authorization is terminated  or revoked sooner.       Influenza A by PCR NEGATIVE NEGATIVE   Influenza B by PCR NEGATIVE NEGATIVE    Comment: (NOTE) The Xpert Xpress SARS-CoV-2/FLU/RSV plus assay is intended as an aid in the diagnosis of influenza from Nasopharyngeal swab specimens and should not be used as a sole basis for treatment. Nasal washings and aspirates are unacceptable for Xpert Xpress SARS-CoV-2/FLU/RSV testing.  Fact Sheet for Patients: EntrepreneurPulse.com.au  Fact Sheet for Healthcare Providers: IncredibleEmployment.be  This test is not yet approved or cleared by the [place] FDA and has been authorized for detection and/or diagnosis of SARS-CoV-2 by FDA under an Emergency Use Authorization (EUA). This EUA will remain in effect (meaning this test can be used) for the duration of the COVID-19 declaration under Section 564(b)(1) of the Act, 21 U.S.C. section 360bbb-3(b)(1), unless the authorization is terminated or revoked.  Performed at [facility], [place] [address] [place], [place] [address]     Medications:  Current Facility-Administered Medications  Medication Dose Route Frequency Provider Last Rate Last Admin   OLANZapine zydis (ZYPREXA) disintegrating tablet 10 mg  10 mg Oral Q8H PRN Lucrezia Starch, MD       And   LORazepam (ATIVAN) tablet 1 mg  1 mg Oral PRN Lucrezia Starch, MD       And   ziprasidone (GEODON) injection 20 mg  20 mg Intramuscular PRN Lucrezia Starch, MD       OLANZapine zydis (ZYPREXA) disintegrating tablet 10 mg  10 mg Oral Daily Leevy-Johnson, Iliani Vejar A, NP       Current Outpatient Medications  Medication Sig Dispense Refill   LORazepam (ATIVAN) 1 MG tablet Take 1 tablet (1 mg total) by mouth 3 (three) times daily as  needed for anxiety. (Patient not taking: Reported on 08/04/2019) 10 tablet 0    Musculoskeletal: Strength & Muscle Tone: within normal limits Gait & Station: normal Patient leans: N/A  Psychiatric Specialty Exam:  Presentation  General Appearance: Bizarre Eye Contact:Fleeting Speech:Blocked Speech Volume:Normal Handedness:No data recorded  Mood and Affect  Mood:Dysphoric Affect:Inappropriate; Non-Congruent  Thought Process  Thought Processes:Goal Directed Descriptions of Associations:Loose  Orientation:Partial  Thought Content:Illogical; Perseveration; Scattered  History of Schizophrenia/Schizoaffective disorder:No  Duration of Psychotic Symptoms:N/A  Hallucinations:Hallucinations: None  Ideas of Reference:None  Suicidal Thoughts:Suicidal Thoughts: No  Homicidal Thoughts:Homicidal Thoughts: No   Sensorium  Memory:Immediate Fair; Recent Poor; Remote Poor Judgment:Impaired Insight:Poor  Executive Functions  Concentration:Poor Attention Span:Poor Recall:Poor Fund of Knowledge:Fair Language:Fair  Psychomotor Activity  Psychomotor Activity:Psychomotor Activity: Mannerisms; Increased  Assets  Assets:Housing; Social Support; Physical Health  Sleep  Sleep:Sleep: Fair  Physical Exam: Physical Exam Vitals and nursing note reviewed.  HENT:     Head: Normocephalic.     Nose: Nose normal.     Mouth/Throat:     Mouth: Mucous membranes are moist.     Pharynx: Oropharynx is clear.  Eyes:     Pupils: Pupils are equal, round, and reactive to light.  Cardiovascular:     Rate and Rhythm: Normal rate.     Pulses: Normal pulses.  Pulmonary:     Effort: Pulmonary effort is normal.  Abdominal:     Palpations: Abdomen is soft.  Musculoskeletal:        General: Normal range of motion.     Cervical back: Normal range of motion.  Skin:    General: Skin is warm and dry.  Neurological:     Mental Status: He is alert.     Comments: Irregular facial movements  noted  Psychiatric:        Attention and Perception: He is inattentive.        Mood and Affect: Affect is inappropriate.        Speech: Speech is delayed.        Behavior: Behavior is withdrawn. Behavior is cooperative.        Thought Content: Thought content is paranoid and delusional. Thought content does not include homicidal or suicidal ideation. Thought content does not include homicidal or suicidal plan.        Cognition and Memory: Cognition is impaired.        Judgment: Judgment is impulsive and inappropriate.   Review of Systems  Neurological:  Positive for sensory change.  Psychiatric/Behavioral:  Positive for hallucinations.   All other systems reviewed and are negative. Blood pressure 115/89, pulse (!) 56, temperature 97.8 F (36.6 C), temperature source Oral, resp. rate 18, height 6' (1.829 m), weight 72.6 kg, SpO2 100 %. Body mass index is 21.7 kg/m.  Treatment Plan Summary: Daily contact with patient to assess and evaluate symptoms and progress in treatment, Medication management, and Plan start Zyprexa 10 mg daily to address psychotic symptoms, seek inpatient psychiatric hospitalization for further observation, stabilization, and treatment.   Disposition: Recommend psychiatric Inpatient admission when medically cleared. Supportive therapy provided about ongoing stressors. Discussed crisis plan, support from social network, calling 911, coming to the Emergency Department, and calling Suicide Hotline.  This service was provided via telemedicine using a 2-way, interactive audio and video technology.  Names of all persons participating in this telemedicine service and their role in this encounter. Name: Oneida Alar Role: PMHNP  Name: Corion Guinan Role: Attending MD  Name: Nathan Mayo Role: patient  Name:  Role:     Inda Merlin, NP 11/29/2021 12:28 PM

## 2021-11-30 MED ORDER — OLANZAPINE 5 MG PO TBDP
5.0000 mg | ORAL_TABLET | Freq: Every day | ORAL | Status: DC
  Administered 2021-12-01 – 2021-12-02 (×2): 5 mg via ORAL
  Filled 2021-11-30 (×2): qty 1

## 2021-11-30 NOTE — ED Notes (Signed)
Patient potassium of 3.3 on 11/28/21. Patient refused recheck on potassium and orthostatic vitals on 11/30/2021. Charge and MD made aware.

## 2021-11-30 NOTE — ED Notes (Signed)
Patient given breakfast tray. Patient is currently eating and responding to internal/external stimuli.

## 2021-11-30 NOTE — ED Notes (Signed)
This Probation officer tried to obtain patients vital signs. Patient asked if vitals signs could be taken when he is more awake. Writer agreed. Vital signs will be placed once patient awakens.

## 2021-11-30 NOTE — Consult Note (Signed)
Patient continues tp meet criteria for inpatient hospitalization.  He is appropriately laughing, talking and responding to internal stimuli and unseen people.  He stops interacting with provider to have a conversation with unseen people and turns and responds to provider question.  We will continue to seek -placement in the Psych in patient setting for treatment and safety.

## 2021-11-30 NOTE — Progress Notes (Signed)
Patient has been denied by [facility] due to no appropriate beds available. Patient meets BH inpatient criteria per Maxie Barb, NP. Patient has been faxed out to the following facilities:   [facility]  [address] [place] [place] [address] [phone]  [facility]  [address] [place] [address] [phone]  [facility]  [address] [place], [place] [address] [phone]  [facility] [address] [place] [address] [phone]  [facility]  [address] [place] [address] [phone]  [facility]  [address] [place] [address] [phone]  [facility]  [phone] [address] [place] [address] [phone]  [facility]  [address], [place] [address] [phone]  [facility]  [address] [place] [address] [phone]  [facility]  [address] [place] [address] [phone]  [facility]  [address] [place] [address] [phone]  [facility]  [address] [place] [address] [phone]  [facility]  [address], [place] [address] [phone]  [facility]  [address] [place] [address] [phone]   Damita Dunnings, MSW, LCSW-A  10:12 AM 11/30/2021

## 2021-11-30 NOTE — ED Provider Notes (Signed)
Emergency Medicine Observation Re-evaluation Note  Nathan Mayo is a 31 y.o. male, seen on rounds today.  Pt initially presented to the ED for complaints of Behavioral Concerns and Hallucinations Currently, the patient is resting quietly.  Physical Exam  BP 101/72 (BP Location: Right Arm)   Pulse 64   Temp 97.8 F (36.6 C) (Oral)   Resp 16   Ht 6' (1.829 m)   Wt 72.6 kg   SpO2 99%   BMI 21.70 kg/m  Physical Exam General: No acute distress Cardiac: Well-perfused Lungs: Nonlabored Psych: Calm  ED Course / MDM  EKG:EKG Interpretation  Date/Time:  Saturday Nov 28 2021 15:19:26 EDT Ventricular Rate:  61 PR Interval:  167 QRS Duration: 137 QT Interval:  403 QTC Calculation: 406 R Axis:   116 Text Interpretation: Sinus rhythm Nonspecific intraventricular conduction delay Baseline wander in lead(s) I III aVL V No old tracing to compare Confirmed by Meridee Score [phone]) on 11/29/2021 12:22:09 PM  I have reviewed the labs performed to date as well as medications administered while in observation.  Recent changes in the last 24 hours include psychiatry evaluation.  Plan  Current plan is for inpatient psychiatric admission. Nathan Mayo is under involuntary commitment.      Nathan Files, MD 11/30/21 [phone]

## 2021-12-01 MED ORDER — STERILE WATER FOR INJECTION IJ SOLN
INTRAMUSCULAR | Status: AC
  Filled 2021-12-01: qty 10

## 2021-12-01 MED ORDER — LORAZEPAM 2 MG/ML IJ SOLN: 2.0000 mg | Freq: Once | INTRAMUSCULAR | Status: DC

## 2021-12-01 NOTE — ED Notes (Signed)
Pt seen responding to internal stimuli. Pt's behavior escalating. Zyprexa offered however pt became upset stating he would not take it and would like to call police. Spoke w/ Dr. Durwin Nora who added ativan. Pt given the choice between taking the pill or having an injection of geodon. Pt chose zyprexa. Pt continues to respond to internal stimuli. Will continue to monitor.

## 2021-12-01 NOTE — Consult Note (Addendum)
Patient was seen on rounds this morning.  He remains confused, disorganized and continues to engage in conversation with unseen people.  Sudden episodes of mood Lability noted and Olanzapine PRN given to patient with good results.  He is not eating much or drinking much.  We will continue to seek inpatient Psychiatric hospitalization.  Patient is unable to engage in any meaningful conversation.  Mid sentence he asks provider not to worry and  covers his face or his whole body.

## 2021-12-01 NOTE — ED Provider Notes (Signed)
Emergency Medicine Observation Re-evaluation Note  Nathan Mayo is a 31 y.o. male, seen on rounds today.  Pt initially presented to the ED for complaints of Behavioral Concerns and Hallucinations Currently, the patient is sleeping.  Physical Exam  BP 111/83 (BP Location: Left Arm)   Pulse (!) 53   Temp 97.8 F (36.6 C) (Oral)   Resp 16   Ht 6' (1.829 m)   Wt 72.6 kg   SpO2 99%   BMI 21.70 kg/m  Physical Exam General: Sleeping, nondistressed Cardiac: Well-perfused Lungs: Breathing unlabored Psych: Deferred  ED Course / MDM  EKG:EKG Interpretation  Date/Time:  Saturday Nov 28 2021 15:19:26 EDT Ventricular Rate:  61 PR Interval:  167 QRS Duration: 137 QT Interval:  403 QTC Calculation: 406 R Axis:   116 Text Interpretation: Sinus rhythm Nonspecific intraventricular conduction delay Baseline wander in lead(s) I III aVL V No old tracing to compare Confirmed by Nathan Mayo [phone]) on 11/29/2021 12:22:09 PM  I have reviewed the labs performed to date as well as medications administered while in observation.  Recent changes in the last 24 hours include none.  Plan  Current plan is for inpatient psych. Nathan Mayo is under involuntary commitment.      Gloris [place], MD 12/01/21 [phone]

## 2021-12-01 NOTE — ED Notes (Signed)
Patient asleep, requests vital signs be obtained when he is awake.

## 2021-12-01 NOTE — ED Notes (Signed)
Pt. With positive response to Zyprexa, no additional behavior needs this shift. Pt/ resting quietly at this time with sitter present, will continue to monitor.

## 2021-12-01 NOTE — Progress Notes (Signed)
Patient has been denied by [facility] due to no beds available. Patient meets BH inpatient criteria per Maxie Barb, NP. Patient has been faxed out to the following facilities:   [facility]  [address] [place] [place] [address] [phone]  [facility]  [address] [place] [address] [phone]  [facility]  [address] [place], [place] [address] [phone]  [facility] [address] [place] [address] [phone]  [facility]  [address] [place] [address] [phone]  [facility]  [address] [place] [address] [phone]  [facility]  [phone] [place] [address] [phone]  [facility]  [address], [place] [address] [phone]  [facility]  [address], [place] [address] [phone]  [facility]  [address], [place] [address] [phone]  [facility]  [address], [place] [address] [phone]  [facility]  [address] [place] [address] [phone]  [facility]  [address] [place] [address] [phone]  [facility]  [address], [place] [address] [phone]   Damita Dunnings, MSW, LCSW-A  11:28 AM 12/01/2021

## 2021-12-02 NOTE — ED Notes (Signed)
Writer has noticed pt talking and laughing to themselves, responding to internal stimuli. Pt will sometimes turn around in their stretcher or stare at the wall for some time then go back to talking to themselves. Pt speaks in a hush voice when talking to themselves.

## 2021-12-02 NOTE — ED Notes (Signed)
Pt's lunch has arrived, Pt requested for a water. Pt sitting up and eating his lunch.

## 2021-12-02 NOTE — ED Notes (Signed)
Pt's dinner has arrived, pt sitting up and eating his dinner 

## 2021-12-02 NOTE — ED Notes (Signed)
Pt requested for a sandwich and a water, due to pt refusing both snacks throughout the day... Clinical research associate provided snacks for pt.

## 2021-12-02 NOTE — ED Notes (Signed)
Pt appears to be responding to internal stimuli. Pt appears to be quietly talking to someone who is not there.

## 2021-12-02 NOTE — ED Notes (Signed)
Pt stood up from his stretcher and walked over to the front desk, asking if he could make a phone call. Pt called his mother and requested to have them pick him up. Pt hung up the phone and became visibly agitated, pt voiced that he was going to call the police once he asked if he could go home and it was made aware (once again) that pt is IVC'd and are currently looking for placement for him. Pt stated they wanted to talk to an officer, Clinical research associate went to [employer] to Tax adviser, Technical sales engineer not at his post so Clinical research associate asked RN to send him over to talk to the pt. [facility] RN made aware and understands that pt would like to talk to him. Writer updated Pharmacist, hospital and pt about Technical sales engineer, pt voiced understanding. Pt currently sitting on the stretcher while looking all around him.

## 2021-12-02 NOTE — ED Provider Notes (Signed)
Emergency Medicine Observation Re-evaluation Note  Nathan Mayo is a 31 y.o. male, seen on rounds today.  Pt initially presented to the ED for complaints of Behavioral Concerns and Hallucinations Currently, the patient is resting.  Physical Exam  BP 108/60 (BP Location: Right Arm)   Pulse (!) 58   Temp 98 F (36.7 C) (Oral)   Resp 18   Ht 1.829 m (6')   Wt 72.6 kg   SpO2 99%   BMI 21.70 kg/m  Physical Exam General: No acute distress Cardiac: Regular rate Lungs: Normal respiratory effort Psych: Deferred  ED Course / MDM  EKG:EKG Interpretation  Date/Time:  Saturday Nov 28 2021 15:19:26 EDT Ventricular Rate:  61 PR Interval:  167 QRS Duration: 137 QT Interval:  403 QTC Calculation: 406 R Axis:   116 Text Interpretation: Sinus rhythm Nonspecific intraventricular conduction delay Baseline wander in lead(s) I III aVL V No old tracing to compare Confirmed by Meridee Score [phone]) on 11/29/2021 12:22:09 PM  I have reviewed the labs performed to date as well as medications administered while in observation.  Recent changes in the last 24 hours include no acute changes.  Plan  Current plan is for inpatient treatment. Nathan Mayo is under involuntary commitment.      Linwood Dibbles, MD 12/02/21 [phone]

## 2021-12-02 NOTE — ED Notes (Signed)
Pt made a phone call to their mother. Pt was made aware that he os allowed 2, 5 minute phone calls. Pt stated they understood. While on the phone with mother, pt had stated that they have been waiting on being pick-up by their mother, but before their mother could respond, pt had hung up. Pt walked to another bathroom down on the hall and asked for a water. Writer provided pt with water, pt back on his stretcher.

## 2021-12-02 NOTE — ED Notes (Signed)
Pt lying in bed quietly. Pt denies SI, HI, and AVH. Pt said he is in the hospital because of a disagreement with his parents. He wanted to go to a vape/smoke shop and his parents did not want him to go.

## 2021-12-02 NOTE — Progress Notes (Signed)
Patient has been denied by [facility] due to no appropriate beds available. Patient meets BH inpatient criteria per Maxie Barb, NP. Patient has been faxed out to the following facilities:   [facility]  [address] [place] [place] [address] [phone]  [facility]  [address] [place] [address] [phone]  [facility]  [address] [place], [place] [address] [phone]  [facility] [address] [place] [address] [phone]  [facility]  [address] [place] [address] [phone]  [facility]  [address] [place] [address] [phone]  [facility]  [phone] [place] [address] [phone]  [facility]  [address] [place] [address] [phone]  [facility]  [address] [place] [address] [phone]  [facility]  [address], [place] [address] [phone]  [facility]  [address] [place] [address] [phone]  [facility]  [address] [place] [address] [phone]  [facility]  [address], [place] [address] [phone]  [facility]  [address], [place] [address] [phone]   Damita Dunnings, MSW, LCSW-A  11:15 AM 12/02/2021

## 2021-12-02 NOTE — ED Notes (Signed)
Pt walked to the bathroom and back to his stretcher, was cooperative. Writer asked pt if he would like a toothbrush/toothpaste but pt only requested for a napkin. Pt facing the wall now.

## 2021-12-02 NOTE — ED Notes (Signed)
Pt requested to make a phone call to his parents, and we agreed he could call. Pt sprang from the bed to the phone. Pt called his mom and said, "Where are you? Where are you? Is Dad still alive? When can ya'll pick me up from here? Are you going to be able to pick me up tomorrow? No. Oh hell." And pt hung up the phone. Again, we explained to pt he is going to inpatient psychiatric treatment. Pt said, "Nah, I want to get out of here. I'm going to call the police." We encouraged pt to wait and speak with a police officer who is onsite. Pt eyes widened and he paced around his bed. Currently, pt is sitting on the side of the bed waiting to speak to law enforcement.

## 2021-12-02 NOTE — ED Notes (Signed)
Patient has been eating and drinking sufficiently when awake during dinner - patient ate his provided mean plus asked for an additional sandwich which was provided to him

## 2021-12-02 NOTE — ED Notes (Signed)
Pt stood up and began peeling off the blankets from his bed. Pt then told writer that his sheets "smell pretty bad". Writer did ask at the beginning of the shift and when pt received his breakfast if pt would like to take a shower but pt refused. Pt refused a shower but wanted his sheets changed. Writer changed pt's bed sheets. Pt sitting at the end of the stretcher.

## 2021-12-02 NOTE — ED Notes (Addendum)
0806:Pt walked to the BR to brush his teeth. Pt's breakfast also arrived at this time, Clinical research associate provided pt with his breakfast. Writer asked pt if he would like anything else, but pt content with everything on his plate.   0820: Pt still hungry and requested for a sandwich, Clinical research associate gave pt a biscuit but notified pt if still hungry will have to wait till snack time which is around 1000. Pt compliant with this

## 2021-12-02 NOTE — ED Notes (Signed)
Pt asked additional questions regarding transfer to a psychiatric hospital which I answered. Pt said he needed to speak to psychiatry ASAP, and he no longer needed to speak with law enforcement. Pt stopped talking for 2-3 minutes. Pt looked at his hands and abruptly said, "Hold up. Am I really at this hospital?" Did did not appear to be talking to M.P. or me. I asked pt if he would like medicine to help calm down. Pt said, "I'm going to calm down." Pt requested more juice which was provided. Pt stared at the juice labels multiple times for extended amounts of time. Then pt laughed inappropriately. Pt stared at his fingers then smelled them.Pt laughed and talked while staring at papers on his bedside table.

## 2021-12-03 MED ORDER — BENZTROPINE MESYLATE 0.5 MG PO TABS
0.5000 mg | ORAL_TABLET | Freq: Two times a day (BID) | ORAL | Status: DC
  Administered 2021-12-05 – 2021-12-07 (×3): 0.5 mg via ORAL
  Filled 2021-12-03 (×8): qty 1

## 2021-12-03 MED ORDER — LIP MEDEX EX OINT
TOPICAL_OINTMENT | CUTANEOUS | Status: DC | PRN
  Administered 2021-12-03: 1 via TOPICAL
  Filled 2021-12-03: qty 7

## 2021-12-03 MED ORDER — DIPHENHYDRAMINE HCL 25 MG PO CAPS
25.0000 mg | ORAL_CAPSULE | Freq: Once | ORAL | Status: AC
  Administered 2021-12-03: 25 mg via ORAL
  Filled 2021-12-03: qty 1

## 2021-12-03 MED ORDER — OLANZAPINE 10 MG PO TBDP
10.0000 mg | ORAL_TABLET | Freq: Two times a day (BID) | ORAL | Status: DC
  Filled 2021-12-03: qty 1

## 2021-12-03 MED ORDER — HALOPERIDOL 5 MG PO TABS
10.0000 mg | ORAL_TABLET | Freq: Two times a day (BID) | ORAL | Status: DC
  Administered 2021-12-03: 10 mg via ORAL
  Filled 2021-12-03 (×3): qty 2

## 2021-12-03 NOTE — ED Provider Notes (Signed)
Emergency Medicine Observation Re-evaluation Note  Lander Hemauer is a 31 y.o. male, seen on rounds today.  Pt initially presented to the ED for complaints of Behavioral Concerns and Hallucinations Pt continues to appear to  respond to internal stimuli, delusional thoughts.   Physical Exam  BP 116/80 (BP Location: Left Arm)   Pulse 87   Temp 98.5 F (36.9 C)   Resp 18   Ht 1.829 m (6')   Wt 72.6 kg   SpO2 100%   BMI 21.70 kg/m  Physical Exam General: alert, content, intermittently pacing in hall.  Cardiac: regular rate. Lungs: breathing comfortably. Psych: alert, cooperative. No SI. Pt does appear to be responding to internal stimuli - delusional thoughts noted.   ED Course / MDM    I have reviewed the labs performed to date as well as medications administered while in observation.  Recent changes in the last 24 hours include ED obs, BH eval and reassessment.   Plan   Omarr Sarma is under involuntary commitment.   Disposition/placement per [facility] team.    Lajean Saver, MD 12/03/21 1535

## 2021-12-03 NOTE — ED Notes (Signed)
Pt very frigidity at this time, restless in bed. Will attempt vitals when calmer.

## 2021-12-03 NOTE — ED Notes (Addendum)
Patient refused his HS zyprexa. He stated that it irritated his internal injury we cannot see LLQ. Notified Dr. Freida Busman. Dr. Freida Busman stated to contact Nira Conn, NP for psych. New order placed and given. He begrudgingly took the medication. Will continue to monitor.

## 2021-12-03 NOTE — ED Notes (Signed)
Pt transferred to room 30 at 12:20 PM. Pt ate a breakfast and lunch tray. Pt continued to respond to internal stimuli. Pt slept from 2:00 PM to the present. Pt mother called. She is concerned about pt.

## 2021-12-03 NOTE — Progress Notes (Signed)
Patient has been denied by [facility] due to no appropriate beds available. Patient meets [place] inpatient criteria per Oneida Alar, NP. Patient has been faxed out to the following facilities:   [facility]  [address] [place] [address] [phone] [facility]  [address] [place] [address] [phone] [facility]  [place], [place] [address] [place]  [facility] [address] [place] [address] [phone] [facility]  [address] [place] [address] [phone]  [facility]  [address], [place] [address] [phone]  [facility]  [phone] [place] [address] [place]  [facility]  [address] [place] [address] [phone]  [facility]  [address] [place] [address] [phone] [facility]  [address], [place] [address] [phone]  [facility]  [address] [place] [address] [phone]  [facility]  [address] [place] [address] [place]  [facility]  [address] [place] [address] [phone]  [facility]  [address] [place] [address] [phone] [place], MSW, LCSW-A  10:59 AM 12/03/2021

## 2021-12-03 NOTE — ED Notes (Addendum)
Cindy from [facility] called for pt assessment, dx, tx hx, medication list, and IVC documentation for pt. Faxed this information to [phone]. Cindy's phone number [phone].

## 2021-12-04 DIAGNOSIS — R443 Hallucinations, unspecified: Secondary | ICD-10-CM

## 2021-12-04 NOTE — ED Notes (Signed)
Pt said, "The medication you gave me yesterday is killing me." Pt said he does not want to take haldol because it make him feel as if he is dying.

## 2021-12-04 NOTE — ED Notes (Signed)
Patient refused medications. Started yelling " no need no need" " Im trying to get it out my system".

## 2021-12-04 NOTE — ED Notes (Signed)
Pt has been calm during past 4 hours, has not eaten much food.

## 2021-12-04 NOTE — ED Notes (Addendum)
Patient came out of room stating that he needs something to help him detox. He also stated that the medication I gave him before he slept for 3 hours is causing to not be able to breathe and he thinks he is going to die. Notified Dr. Read Drivers. He stated to give him the IM ativan. Spoke to patient about giving him the ativan. He stated he is going to wait it out. Will continue to monitor.

## 2021-12-04 NOTE — ED Notes (Signed)
Patient only slept 3 hours. He is alert and cooperative. He is paranoid and doesn't want to take any medication. He has been responding from internal/external stimuli. He is restless and constantly getting up and down.

## 2021-12-04 NOTE — Progress Notes (Signed)
Contacted by nursing. Patient refuses to take Zyprexa, states that it irritates his internal injury. Patient reported that he is open to other options. Changed zyprexa to haldol 10 mg BID and added cogentin 0.5 mg BID for EPS prevention.    Nira Conn, APRN, FNP-C, PMHNP-BC
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
## 2021-12-04 NOTE — ED Notes (Signed)
Pt sitting on the side of his bed, staring at the bottom of the wall, and periodically squinting his eyes. He occasionally presses his hands to his stomach and says it hurts. He says she doesn't wish this pain on anyone. Pt attributes the pain to the medication he took yesterday.

## 2021-12-04 NOTE — Progress Notes (Signed)
Inpatient Behavioral Health Placement  Pt meets inpatient criteria per Maxie Barb, NP. There are no available beds at [facility] per [facility] Ravens, RN.  Referral was sent to the following facilities;   Destination Service Provider Address Phone [facility]  [address] [place]., [place] [address] [phone]  [facility]  [address] [place] [address] [phone]  [facility]  [address] [place], [place] [address] [phone]  [facility] [address] [place] [address] [phone]  [facility]  [address] [place] [address] [phone]  [facility]  [address] [place] [address] [phone]  [facility]  [address] [place] [address] [phone]  [facility]  [address] [place] [address] [phone]  [facility]  [address] [place] [address] [phone]  [facility]  [address] [place] [address] [phone]  [facility]  [address] [place] [address] [phone]  [facility]  [address] [place] [address] [phone]  [facility]  [address] [place] [address] [phone]  [facility]  [address], [place] [address] [phone]  [facility]  [address], [place] [address] [phone]  [facility]  [address] [place] [address] [phone]  [facility]  [address], [place] [address]  [phone]  [facility]  [address] [place] [address] [phone]    Situation ongoing,  CSW will follow up.   Maryjean Ka, MSW, [facility] 12/04/2021  @ 7:09 PM

## 2021-12-04 NOTE — ED Notes (Addendum)
Pt stood in his room and said he was dying multiple times. Pt said, "Give me a shot right now. I can't breathe." Took pt vitals and did a brief physical assessment which was negative. Offered pt zyprexa ODT which he agreed to take.
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
## 2021-12-05 MED ORDER — HALOPERIDOL 5 MG PO TABS
5.0000 mg | ORAL_TABLET | Freq: Every day | ORAL | Status: DC
  Administered 2021-12-06: 5 mg via ORAL
  Filled 2021-12-05: qty 1

## 2021-12-05 NOTE — ED Notes (Signed)
Patient would get up periodically and eat and drink and lay back down.

## 2021-12-05 NOTE — ED Notes (Signed)
Patient alert. Patient refused medication. Patient states, :it giving him adverse effects'.  Patient disorganized.  Limited insight

## 2021-12-05 NOTE — ED Notes (Signed)
Patient alert. Patient refused medication this shift.  Patient guarded. Blunted.  Anxious at times. Denied suicidal ideation and homicidal ideation.

## 2021-12-05 NOTE — Progress Notes (Signed)
[facility] Psych ED Progress Note  12/05/2021 2:19 PM Enrico Eaddy  MRN:  [id number]   Subjective:  Nathan Mayo is a 31 year old male with past psychiatric history of schizophreniform disorder, psychosis, cannabis induced psychotic disorder who presented to [facility] via IVC after "threatening to kill his parents per chart review. He has been in the ER waiting for admission bed to treat his Psychosis.  Patient was on Olanzapine for five days but did not improve at all.  He is switched to Haldol 10 mg twice a day but he is reacting to it and refused it this morning.  He informed writer this morning that Haldol made his body fell sick, stomach sick as well.  Haldol is decreased to 5 mg po at bed time.  We will continue to seek bed placement while treating patient.  Nursing reported that patient was found with his head inside the toilet bowl.  He reported at the time that he wanted to die.  This morning he continued talking to sell, staring up at the ceiling and holding unto his abdomen. Principal Problem: Psychosis (HCC) Diagnosis:  Principal Problem:   Psychosis (HCC) Active Problems:   Schizophreniform disorder with good prognostic features [facility])   ED Assessment Time Calculation: Start Time: 1406 Stop Time: 1419 Total Time in Minutes (Assessment Completion): 13   Past Psychiatric History: schizophreniform disorder, psychosis, cannabis induced psychotic disorder.    Grenada Scale:  Flowsheet Row ED from 11/28/2021 in [facility] from 08/04/2019 in [facility] ED from 01/23/2018 in [facility] EMERGENCY DEPARTMENT  C-SSRS RISK CATEGORY No Risk No Risk No Risk       Past Medical History: History reviewed. No pertinent past medical history. History reviewed. No pertinent surgical history. Family History: No family history on file. Family Psychiatric  History: unknown Social History:  Social History   Substance and  Sexual Activity  Alcohol Use No     Social History   Substance and Sexual Activity  Drug Use No    Social History   Socioeconomic History   Marital status: Single    Spouse name: Not on file   Number of children: Not on file   Years of education: Not on file   Highest education level: Not on file  Occupational History   Not on file  Tobacco Use   Smoking status: Never   Smokeless tobacco: Never  Vaping Use   Vaping Use: Every day  Substance and Sexual Activity   Alcohol use: No   Drug use: No   Sexual activity: Never  Other Topics Concern   Not on file  Social History Narrative   Not on file   Social Determinants of Health   Financial Resource Strain: Not on file  Food Insecurity: Not on file  Transportation Needs: Not on file  Physical Activity: Not on file  Stress: Not on file  Social Connections: Not on file    Sleep: Good  Appetite:  Poor  Current Medications: Current Facility-Administered Medications  Medication Dose Route Frequency Provider Last Rate Last Admin   benztropine (COGENTIN) tablet 0.5 mg  0.5 mg Oral BID Jackelyn Poling, NP       [START ON 12/06/2021] haloperidol (HALDOL) tablet 5 mg  5 mg Oral QHS Shakerria Parran C, NP       lip balm (CARMEX) ointment   Topical PRN Jackelyn Poling, NP   1 application. at 12/03/21 2143  LORazepam (ATIVAN) injection 2 mg  2 mg Intramuscular Once Gloris Manchester, MD       OLANZapine zydis (ZYPREXA) disintegrating tablet 10 mg  10 mg Oral Q8H PRN Milagros Loll, MD   10 mg at 12/04/21 6803   And   ziprasidone (GEODON) injection 20 mg  20 mg Intramuscular PRN Milagros Loll, MD       Current Outpatient Medications  Medication Sig Dispense Refill   LORazepam (ATIVAN) 1 MG tablet Take 1 tablet (1 mg total) by mouth 3 (three) times daily as needed for anxiety. (Patient not taking: Reported on 08/04/2019) 10 tablet 0    Lab Results: No results found for this or any previous visit (from the past 48  hour(s)).  Blood Alcohol level:  Lab Results  Component Value Date   ETH <10 11/28/2021   ETH <10 08/04/2019    Physical Findings:  CIWA:    COWS:     Musculoskeletal: Strength & Muscle Tone: within normal limits Gait & Station: normal Patient leans: Front  Psychiatric Specialty Exam:  Presentation  General Appearance: Bizarre  Eye Contact:Fleeting  Speech:Pressured  Speech Volume:Normal  Handedness:No data recorded  Mood and Affect  Mood:Dysphoric; Labile  Affect:Inappropriate   Thought Process  Thought Processes:Goal Directed  Descriptions of Associations:Tangential  Orientation:Partial  Thought Content:Illogical; Perseveration; Paranoid Ideation; Tangential; Scattered; Rumination  History of Schizophrenia/Schizoaffective disorder:No  Duration of Psychotic Symptoms:N/A  Hallucinations:Hallucinations: None  Ideas of Reference:Paranoia; Percusatory; Delusions  Suicidal Thoughts:Suicidal Thoughts: No  Homicidal Thoughts:Homicidal Thoughts: No   Sensorium  Memory:Immediate Fair; Recent Fair; Remote Fair  Judgment:Impaired  Insight:Poor   Executive Functions  Concentration:Poor  Attention Span:Poor  Recall:Poor  Fund of Knowledge:Fair  Language:Fair   Psychomotor Activity  Psychomotor Activity:Psychomotor Activity: Increased   Assets  Assets:Resilience; Social Support; Physical Health   Sleep  Sleep:Sleep: Fair    Physical Exam: Physical Exam ROS Blood pressure 111/75, pulse 73, temperature 97.8 F (36.6 C), temperature source Oral, resp. rate 20, height 6' (1.829 m), weight 72.6 kg, SpO2 96 %. Body mass index is 21.7 kg/m.   Medical Decision Making: Continue seeking inpatient Psychiatry hospitalization.  Continue Medication management-Decrease Haldol to 5 mg at bed time to encourage compliance.  Problem 1:  schizophreniform disorder, psychosis,  Problem 2: cannabis induced psychotic disorder  Earney Navy,  NP-PMHNP-BC 12/05/2021, 2:19 PM

## 2021-12-05 NOTE — ED Notes (Signed)
Pt asleep at dinner time.pt dinner at the nursing station

## 2021-12-06 NOTE — Progress Notes (Signed)
CSW followed-up Hope with [facility], in reference to referral sent for possible placement. It was reported that there are currently no beds available.    Teleshia Lemere, MSW, LCSW-A, LCAS Phone: [phone] Disposition/TOC    

## 2021-12-06 NOTE — ED Provider Notes (Signed)
Emergency Medicine Observation Re-evaluation Note  Nathan Mayo is a 31 y.o. male, seen on rounds today.  Pt initially presented to the ED for complaints of Behavioral Concerns and Hallucinations Currently, the patient is sleeping.  Physical Exam  BP 103/73 (BP Location: Left Arm)   Pulse 60   Temp (!) 97.4 F (36.3 C) (Oral)   Resp 15   Ht 6' (1.829 m)   Wt 72.6 kg   SpO2 100%   BMI 21.70 kg/m  Physical Exam General: No acute distress Cardiac: Regular rate Lungs: No respiratory distress Psych: Currently cooperative  ED Course / MDM  EKG:EKG Interpretation  Date/Time:  Saturday Nov 28 2021 15:19:26 EDT Ventricular Rate:  61 PR Interval:  167 QRS Duration: 137 QT Interval:  403 QTC Calculation: 406 R Axis:   116 Text Interpretation: Sinus rhythm Nonspecific intraventricular conduction delay Baseline wander in lead(s) I III aVL V No old tracing to compare Confirmed by Aletta Edouard [phone]) on 11/29/2021 12:22:09 PM  I have reviewed the labs performed to date as well as medications administered while in observation.  Recent changes in the last 24 hours include episodes of anxiety.  Plan  Current plan is for admission. Nathan Mayo is under involuntary commitment.      Nathan Biles, MD 12/06/21 [phone]

## 2021-12-06 NOTE — ED Notes (Signed)
Patient has been alert this shift. Disorganized thoughts.   Patient not taking medication.  Patient gets upset when discussing medication. Patient staring and guarded.   Seeing things not there.

## 2021-12-06 NOTE — ED Notes (Signed)
Patient resting comfortably

## 2021-12-06 NOTE — ED Notes (Signed)
Patient refused medication 

## 2021-12-06 NOTE — Progress Notes (Addendum)
[facility] Psych ED Progress Note  12/06/2021 5:56 PM Nathan Mayo  MRN:  [id number]   Subjective:   Patient has been refusing Medications for two days now.  He received first dose of Haldol and since then has been reported that it affected his breathing and stomach.  He remains confused, disorganized and continues to engage in conversation with unseen people.  Sudden episodes of mood Lability noted especially when patient is asked why he does not want to take his medications.  Insight and judgement remains very poor.  He is not eating much or drinking much but takes showers when prompted.  He strongly believes nothing is wrong with him.  Meanwhile, patient still talks to unseen people, smiles inappropriately and makes hand gestures as if he is having conversation with somebody.  .  We will continue to seek inpatient Psychiatric hospitalization.  Patient is unable to engage in any meaningful conversation. Principal Problem: Psychosis (HCC) Diagnosis:  Principal Problem:   Psychosis (HCC) Active Problems:   Schizophreniform disorder with good prognostic features [facility])   ED Assessment Time Calculation: Start Time: 1748 Stop Time: 1800 Total Time in Minutes (Assessment Completion): 12   Past Psychiatric History: see initial Psychiatric evaluation note  G[place]ada Scale:  Flowsheet Row ED from 11/28/2021 in [facility] from [facility]/04/2019 [facility] from 01/23/2018 in [facility] EMERGENCY DEPARTMENT  C-SSRS RISK CATEGORY No Risk No Risk No Risk       Past Medical History: History reviewed. No pertinent past medical history. History reviewed. No pertinent surgical history. Family History: No family history on file. Family Psychiatric  History: see initial Psychiatric evaluation note. Social History:  Social History   Substance and Sexual Activity  Alcohol Use No     Social History   Substance and Sexual  Activity  Drug Use No    Social History   Socioeconomic History   Marital status: Single    Spouse name: Not on file   Number of children: Not on file   Years of education: Not on file   Highest education level: Not on file  Occupational History   Not on file  Tobacco Use   Smoking status: Never   Smokeless tobacco: Never  Vaping Use   Vaping Use: Every day  Substance and Sexual Activity   Alcohol use: No   Drug use: No   Sexual activity: Never  Other Topics Concern   Not on file  Social History Narrative   Not on file   Social Determinants of Health   Financial Resource Strain: Not on file  Food Insecurity: Not on file  Transportation Needs: Not on file  Physical Activity: Not on file  Stress: Not on file  Social Connections: Not on file    Sleep: Good  Appetite:  Poor  Current Medications: Current Facility-Administered Medications  Medication Dose Route Frequency Provider Last Rate Last Admin   benztropine (COGENTIN) tablet 0.5 mg  0.5 mg Oral BID Nira Conn A, NP   0.5 mg at 12/05/21 2007   haloperidol (HALDOL) tablet 5 mg  5 mg Oral QHS Paytience Bures C, NP       lip balm (CARMEX) ointment   Topical PRN Jackelyn Poling, NP   1 application. at 12/03/21 2143   LORazepam (ATIVAN) injection 2 mg  2 mg Intramuscular Once Gloris Manchester, MD       OLANZapine zydis (ZYPREXA) disintegrating tablet 10 mg  10 mg  Oral Q8H PRN Milagros Loll, MD   10 mg at 12/04/21 1610   And   ziprasidone (GEODON) injection 20 mg  20 mg Intramuscular PRN Milagros Loll, MD       Current Outpatient Medications  Medication Sig Dispense Refill   LORazepam (ATIVAN) 1 MG tablet Take 1 tablet (1 mg total) by mouth 3 (three) times daily as needed for anxiety. (Patient not taking: Reported on 08/04/2019) 10 tablet 0    Lab Results: No results found for this or any previous visit (from the past 48 hour(s)).  Blood Alcohol level:  Lab Results  Component Value Date   ETH <10  11/28/2021   ETH <10 08/04/2019    Physical Findings:  CIWA:    COWS:     Musculoskeletal: Strength & Muscle Tone: within normal limits Gait & Station: normal Patient leans: Front  Psychiatric Specialty Exam:  Presentation  General Appearance: Casual; Fairly Groomed  Eye Contact:Good  Speech:Clear and Coherent; Normal Rate  Speech Volume:Normal  Handedness:No data recorded  Mood and Affect  Mood:Anxious; Irritable  Affect:Congruent   Thought Process  Thought Processes:Disorganized  Descriptions of Associations:Tangential  Orientation:Full (Time, Place and Person)  Thought Content:Illogical  History of Schizophrenia/Schizoaffective disorder:Yes  Duration of Psychotic Symptoms:Greater than six months  Hallucinations:Hallucinations: Auditory; Visual  Ideas of Reference:Paranoia; Percusatory; Delusions  Suicidal Thoughts:Suicidal Thoughts: No  Homicidal Thoughts:Homicidal Thoughts: No   Sensorium  Memory:Immediate Poor; Recent Poor; Remote Poor  Judgment:Poor  Insight:Poor   Executive Functions  Concentration:Poor  Attention Span:Poor  Recall:Poor  Fund of Knowledge:Poor  Language:Fair   Psychomotor Activity  Psychomotor Activity:Psychomotor Activity: Normal   Assets  Assets:Housing; Communication Skills   Sleep  Sleep:Sleep: Good    Physical Exam: Physical Exam ROS Blood pressure 102/61, pulse 67, temperature 98.1 F (36.7 C), temperature source Oral, resp. rate 16, height 6' (1.829 m), weight 72.6 kg, SpO2 96 %. Body mass index is 21.7 kg/m.   Medical Decision Making: Due to patient refusing Medications for two days, we will consider forced Medication protocol. Patient continues to require inpatient Psychiatric hospitalization. Problem 1:  Schizophreniform disorder with good prognostic features (HCC) Problem 2: cannabis induced psychotic disorder  Earney Navy, NP-PMHNP-BC 12/06/2021, 5:56 PM

## 2021-12-06 NOTE — Progress Notes (Signed)
Per Maxie Barb, NP, patient meets criteria for inpatient treatment. There are no available beds at [facility] today, per [facility]. CSW faxed referrals to the following facilities for review:  [facility]  Pending - Request Sent N/A [address] [place] [place] [address] [phone] --  [facility]  Pending - Request Sent N/A [address] [place] [address] [phone] --  [facility]  Pending - Request Sent N/A [address] [place], [place] [address] [phone] --  [facility]  Pending - Request Sent N/A [address] [place] [address] [phone] --  [facility]  Pending - Request Sent N/A [address] [place] [address] [phone] --  [facility]  Pending - Request Sent N/A [address], [place] [address] [phone] --  [facility]  Pending - Request Sent N/A [phone] [place] [address] [phone] --  [facility]  Pending - Request Sent N/A [address] [place] [address] [phone] --  [facility]  Pending - Request Sent N/A [address], [place] [address] [phone] --  [facility]  Pending - Request Sent N/A [address] [place] [address] [phone] --  [facility]  Pending - Request Sent N/A [address] [place] [address] [phone] --  [facility]  Pending - Request Sent N/A [address] [place] [address] [phone] --  [facility]  Pending - Request Sent N/A [address] [place] [address] [phone] --  [facility]  Pending  - Request Sent N/A [address], [place] [address] [phone] --  [facility]  Pending - No Request Sent N/A [address] [place] [address] [phone] --  [facility]  Pending - No Request Sent N/A [address] [place] [address] [phone] --  [facility]  Pending - No Request Sent N/A [address] [place] [address] [phone] --  [facility]  Pending - No Request Sent N/A [address], [place] [address] [phone] --  [facility]  Pending - No Request Sent N/A [address], [place] [address] [phone] --  [facility]  Pending - No Request Sent N/A [address], [place] [address] [phone] --  [facility]  Pending - No Request Sent N/A [address] [place] [address] [phone] --  [facility]  Pending - No Request Sent N/A [address] [place] [address] [phone] --   TTS will continue to seek bed placement.  [name], MSW, Lenice Pressman Phone: [phone] Disposition/TOC

## 2021-12-07 MED ORDER — DIPHENHYDRAMINE HCL 25 MG PO CAPS
25.0000 mg | ORAL_CAPSULE | Freq: Two times a day (BID) | ORAL | Status: DC
  Administered 2021-12-07: 25 mg via ORAL
  Filled 2021-12-07 (×2): qty 1

## 2021-12-07 MED ORDER — RISPERIDONE 1 MG PO TABS
1.0000 mg | ORAL_TABLET | Freq: Two times a day (BID) | ORAL | Status: DC
  Administered 2021-12-07: 1 mg via ORAL
  Filled 2021-12-07 (×2): qty 1

## 2021-12-07 NOTE — Progress Notes (Signed)
Inpatient Behavioral Health Placement  Pt meets inpatient criteria per Dahlia Byes, NP.  There are no available beds at [facility] per [facility] Rona Ravens, RN. Referral was sent to the following facilities;   Destination Service Provider Address Phone [facility]  [address] [place] [place] [address] [phone]  [facility]  [address] [place] [address] [phone]  [facility]  [address] [place], [place] [address] [phone]  [facility] [address] [place] [address] [phone]  [facility]  [address] [place] [address] [phone]  [facility]  [address] [place] [address] [phone]  [facility]  [phone] [address] [place] [address] [phone]  [facility]  [address], [place] [address] [phone]  [facility]  [address] [place] [address] [phone]  [facility]  [address] [place] [address] [phone]  [facility]  [address], [place] [address] [phone]  [facility]  [address] [place] [address] [phone]  [facility]  [address] [place] [address] [phone]  [facility]  [address] [place] [address] [phone]  [facility]  [address] [place] [address] [phone]  [facility]  [address] [place] [address] [phone]  [facility]  [address] [place] [address] [phone]  [facility]  [address] [place] [address] [phone]  [facility]  [address] [place] [address] [phone]  [facility]  [address] [place] [address] [phone]  [facility]  [address], [place] [address] [phone]  [facility]  [address] [place] [address] [phone]    Situation ongoing,  CSW will follow up.   Maryjean Ka, MSW, [facility] 12/07/2021  @ 9:04 PM

## 2021-12-07 NOTE — Consult Note (Signed)
Patient remains Bizarre holding unto his abdomen after taking Haldol.  Patient reported that Haldol is affecting his breathing.  V/S stable, O2 sat 98-99% on room air.  Patient however, is not talking much to unseen people or inappropriately smiling much.  We will continue to monitor.  He declined offer for his parents to visit him in the unit.

## 2021-12-07 NOTE — ED Provider Notes (Signed)
Emergency Medicine Observation Re-evaluation Note  Nathan Mayo is a 31 y.o. male, seen on rounds today.  Pt initially presented to the ED for complaints of Behavioral Concerns and Hallucinations Currently, the patient is sleeping.  Physical Exam  BP 98/69 (BP Location: Right Arm)   Pulse 60   Temp (!) 97.4 F (36.3 C) (Oral)   Resp 16   Ht 6' (1.829 m)   Wt 72.6 kg   SpO2 98%   BMI 21.70 kg/m  Physical Exam General: Sleeping Cardiac: Extremities well-perfused Lungs: Breathing is even and unlabored Psych: Deferred  ED Course / MDM  EKG:EKG Interpretation  Date/Time:  Saturday Nov 28 2021 15:19:26 EDT Ventricular Rate:  61 PR Interval:  167 QRS Duration: 137 QT Interval:  403 QTC Calculation: 406 R Axis:   116 Text Interpretation: Sinus rhythm Nonspecific intraventricular conduction delay Baseline wander in lead(s) I III aVL V No old tracing to compare Confirmed by Meridee Score [phone]) on 11/29/2021 12:22:09 PM  I have reviewed the labs performed to date as well as medications administered while in observation.  Recent changes in the last 24 hours include continued psychosis.  Plan  Current plan is for inpatient psychiatric admission. Jaylan Hinojosa is under involuntary commitment.      Gloris [place], MD 12/07/21 [phone]

## 2021-12-07 NOTE — ED Notes (Signed)
Pt calm and cooperative. Pt sat on the side of his bed and moved his feet up and down as if he was walking in place. When asked how he was doing pt said, "I have to hold my stomach non-stop. I can't feel my immune system. It's not your fault. They gave me the wrong medicine. There's nothing you can do about it."

## 2021-12-08 DIAGNOSIS — F2081 Schizophreniform disorder: Secondary | ICD-10-CM

## 2021-12-08 NOTE — Progress Notes (Signed)
[facility] Psych ED Progress Note  12/08/2021 3:56 PM Nathan Mayo  MRN:  [id number]  Subjective:  Patient did not take Haldol for three days complaining of breathing problem and constantly holding unto his abdomen.  Yesterday Haldol was discontinued and Risperidone 1 mg po bid started.  He refused this last night and today.  Patient continues to respond to internal stimuli.  He stands and sits staring up and around the room.  Lacks total insight in his mental illness and need to take Medications.  Patient want to be discharged when he has not made any improvement since admission.  We may consider Forced Medication protocol in am.  He is eating and drinking but refuses his Medication.  We will start the process for [facility] since he is still sick and refusing medications. Principal Problem: Psychosis ([place]) Diagnosis:  Principal Problem:   Psychosis ([place]) Active Problems:   Schizophreniform disorder with good prognostic features [facility])   ED Assessment Time Calculation: Start Time: 0336 Stop Time: [id number] Total Time in Minutes (Assessment Completion): 20   Past Psychiatric History: see initial Psychiatry evaluation  [place] Scale:  [place] ED from 11/28/2021 in [place] DEPT ED from 08/04/2019 in [place] DEPT ED from 01/23/2018 in [facility] No Risk No Risk No Risk       Past Medical History: History reviewed. No pertinent past medical history. History reviewed. No pertinent surgical history. Family History: No family history on file. Family Psychiatric  History: see initial psychiatry evaluation Social History:  Social History   Substance and Sexual Activity  Alcohol Use No     Social History   Substance and Sexual Activity  Drug Use No    Social History   Socioeconomic History   Marital status: Single    Spouse name: Not on file   Number of children: Not on file    Years of education: Not on file   Highest education level: Not on file  Occupational History   Not on file  Tobacco Use   Smoking status: Never   Smokeless tobacco: Never  Vaping Use   Vaping Use: Every day  Substance and Sexual Activity   Alcohol use: No   Drug use: No   Sexual activity: Never  Other Topics Concern   Not on file  Social History Narrative   Not on file   Social Determinants of Health   Financial Resource Strain: Not on file  Food Insecurity: Not on file  Transportation Needs: Not on file  Physical Activity: Not on file  Stress: Not on file  Social Connections: Not on file    Sleep: Good  Appetite:  Good  Current Medications: Current Facility-Administered Medications  Medication Dose Route Frequency Provider Last Rate Last Admin   benztropine (COGENTIN) tablet 0.5 mg  0.5 mg Oral BID Lindon Romp A, NP   0.5 mg at 12/07/21 2045   diphenhydrAMINE (BENADRYL) capsule 25 mg  25 mg Oral BID Charmaine Downs C, NP   25 mg at 12/07/21 2045   lip balm (CARMEX) ointment   Topical PRN Rozetta Nunnery, NP   1 application. at 12/03/21 2143   LORazepam (ATIVAN) injection 2 mg  2 mg Intramuscular Once Godfrey Pick, MD       OLANZapine zydis (ZYPREXA) disintegrating tablet 10 mg  10 mg Oral Q8H PRN Lucrezia Starch, MD   10 mg at 12/04/21 [id number]   And  ziprasidone (GEODON) injection 20 mg  20 mg Intramuscular PRN Lucrezia Starch, MD       risperiDONE (RISPERDAL) tablet 1 mg  1 mg Oral BID Charmaine Downs C, NP   1 mg at 12/07/21 2045   Current Outpatient Medications  Medication Sig Dispense Refill   LORazepam (ATIVAN) 1 MG tablet Take 1 tablet (1 mg total) by mouth 3 (three) times daily as needed for anxiety. (Patient not taking: Reported on 08/04/2019) 10 tablet 0    Lab Results: No results found for this or any previous visit (from the past 48 hour(s)).  Blood Alcohol level:  Lab Results  Component Value Date   ETH <10 11/28/2021   ETH <10 08/04/2019     Physical Findings:  CIWA:    COWS:     Musculoskeletal: Strength & Muscle Tone: within normal limits Gait & Station: normal Patient leans: Front  Psychiatric Specialty Exam:  Presentation  General Appearance: Casual; Neat  Eye Contact:Good  Speech:Clear and Coherent; Normal Rate  Speech Volume:Normal  Handedness:Right   Mood and Affect  Mood:Irritable; Angry  Affect:Congruent; Labile   Thought Process  Thought Processes:Disorganized  Descriptions of Associations:Intact  Orientation:Full (Time, Place and Person)  Thought Content:Logical; Illogical  History of Schizophrenia/Schizoaffective disorder:Yes  Duration of Psychotic Symptoms:Greater than six months  Hallucinations:Hallucinations: Auditory; Visual  Ideas of Reference:Paranoia  Suicidal Thoughts:Suicidal Thoughts: No  Homicidal Thoughts:Homicidal Thoughts: No   Sensorium  Memory:Immediate Fair; Recent Fair; Remote Poor  Judgment:Poor  Insight:Poor   Executive Functions  Concentration:Poor  Attention Span:Poor  Recall:Poor  Fund of Knowledge:Poor  Language:Poor   Psychomotor Activity  Psychomotor Activity:Psychomotor Activity: Normal   Assets  Assets:Communication Skills   Sleep  Sleep:Sleep: Good    Physical Exam: Physical Exam ROS Blood pressure 104/75, pulse 72, temperature (!) 97.5 F (36.4 C), temperature source Oral, resp. rate 20, height 6' (1.829 m), weight 72.6 kg, SpO2 97 %. Body mass index is 21.7 kg/m.   Medical Decision Making: Patient is not compliant with medications.  We will start process for [facility]. He remains Psychotic talking, laughing at unseen people.  Problem 1:  Schizophreniform disorder with good prognostic features ([place])  Problem 2: Unspecified Psychosis  Delfin Gant, NP-PMHNP-BC 12/08/2021, 3:56 PM

## 2021-12-08 NOTE — ED Provider Notes (Signed)
Emergency Medicine Observation Re-evaluation Note  Nathan Mayo is a 31 y.o. male, seen on rounds today.  Pt initially presented to the ED for complaints of Behavioral Concerns and Hallucinations Currently, the patient is resting, awake.  Physical Exam  BP 104/75 (BP Location: Left Arm)   Pulse 72   Temp (!) 97.5 F (36.4 C) (Oral)   Resp 20   Ht 6' (1.829 m)   Wt 72.6 kg   SpO2 97%   BMI 21.70 kg/m  Physical Exam HENT:     Head: Normocephalic.  Neurological:     General: No focal deficit present.     Mental Status: He is alert.  Psychiatric:        Mood and Affect: Mood normal.    ED Course / MDM  EKG:EKG Interpretation  Date/Time:  Saturday Nov 28 2021 15:19:26 EDT Ventricular Rate:  61 PR Interval:  167 QRS Duration: 137 QT Interval:  403 QTC Calculation: 406 R Axis:   116 Text Interpretation: Sinus rhythm Nonspecific intraventricular conduction delay Baseline wander in lead(s) I III aVL V No old tracing to compare Confirmed by Meridee Score [phone]) on 11/29/2021 12:22:09 PM  I have reviewed the labs performed to date as well as medications administered while in observation.  Recent changes in the last 24 hours include nothing.  Plan  Current plan is for inpatient placement. Kishan Wachsmuth is under involuntary commitment.      Virgina Norfolk, DO 12/08/21 [phone]

## 2021-12-08 NOTE — ED Notes (Signed)
Placed medications in juice, refused to drink. Stating "I will not be poisoned".

## 2021-12-08 NOTE — ED Notes (Signed)
Patient reporting "I do not need meds, the only meds I need is to go home".

## 2021-12-08 NOTE — ED Notes (Signed)
Patient refused his medications this evening. He is alert and cooperative staying in his room. Will continue to monitor.

## 2021-12-09 NOTE — ED Notes (Signed)
Patient DC d off unit to facility per provider. Patient alert, cooperative, no s/s of distress. DC information and belongings given to sheriff for transport. Patient ambulatory off  unit, escorted and transported by sheriff.   

## 2021-12-09 NOTE — Progress Notes (Signed)
Patient has been denied by [facility] due to no appropriate beds available. Patient meets [place] inpatient criteria per Charmaine Downs, NP. Patient has been faxed out to the following facilities:   [facility]  [address] [place] [address] [phone] [facility]  [address] [place] [address] [phone] [facility]  [place], [place] [address] [place]  [facility] [address] [place] [address] [phone] [facility]  [address] [place] [address] [phone]  [facility]  [address] [place] [address] [phone]  [facility]  [phone] [address] [place] [address] [place]  [facility]  [address], [place] [address] [phone]  [facility]  [address], [place] [address] [phone] [facility]  [address], [place] [address] [phone]  [facility]  [address], [place] [address] [phone]  [facility]  [address] [place] [address] [place]  [facility]  [address], [place] [address] [phone]  [facility]  [address], [place] [address] [phone] [facility]  [address] [place] [address] [phone] [address] [place] [place] [address] [phone]  [place]  [place], [place] [address] [phone]  [facility]  [address] [place] [address] [phone] [place]  [address] [place] [address] [phone]  [place] [address], [place] [address] [phone]  [facility]  [address] [place] [address] [phone] [facility]  [address] [place] [address] [phone] [place], MSW, LCSW-A  1:36 PM 12/09/2021

## 2021-12-09 NOTE — Progress Notes (Signed)
[facility] LCSW Progress Note   12/09/2021    3:23 PM  Nathan Mayo   [id number]   Type of Contact and Topic:  Psychiatric Bed Placement   Pt accepted to [facility]   Patient meets inpatient criteria per Charmaine Downs, NP  The attending provider will be Earlene Plater, MD   Call report to [phone] or [phone]  Wilmon Arms, RN @ [facility] notified.     Pt scheduled  to arrive at [place] pending a negative [place], MSW, LCSW-A  3:24 PM 12/09/2021

## 2021-12-09 NOTE — ED Notes (Signed)
Patient slept between 2-3 hours per shift. He was alert and cooperative through shift. He was very pleasant during the shift but refused all medications.

## 2021-12-09 NOTE — ED Notes (Signed)
Have continued to try to give report. There has been not answer.  Was able to just leave a message.

## 2022-06-13 ENCOUNTER — Ambulatory Visit (HOSPITAL_COMMUNITY)
Admission: EM | Admit: 2022-06-13 | Discharge: 2022-06-15 | Disposition: A | Discharge status: Other Acute Inpatient Hospital | Payer: No Payment, Other | Attending: Urology | Admitting: Urology

## 2022-06-13 DIAGNOSIS — F209 Schizophrenia, unspecified: Secondary | ICD-10-CM | POA: Insufficient documentation

## 2022-06-13 DIAGNOSIS — Z1152 Encounter for screening for COVID-19: Secondary | ICD-10-CM | POA: Insufficient documentation

## 2022-06-13 DIAGNOSIS — F2081 Schizophreniform disorder: Secondary | ICD-10-CM

## 2022-06-13 LAB — POCT URINE DRUG SCREEN - MANUAL ENTRY (I-SCREEN)
POC Amphetamine UR: NOT DETECTED
POC Buprenorphine (BUP): NOT DETECTED
POC Cocaine UR: NOT DETECTED
POC Marijuana UR: POSITIVE — AB
POC Methadone UR: NOT DETECTED
POC Methamphetamine UR: NOT DETECTED
POC Morphine: NOT DETECTED
POC Oxazepam (BZO): NOT DETECTED
POC Oxycodone UR: NOT DETECTED
POC Secobarbital (BAR): NOT DETECTED

## 2022-06-13 LAB — POC SARS CORONAVIRUS 2 AG: SARSCOV2ONAVIRUS 2 AG: NEGATIVE

## 2022-06-13 MED ORDER — MAGNESIUM HYDROXIDE 400 MG/5ML PO SUSP: 30.0000 mL | Freq: Every day | ORAL | Status: DC | PRN

## 2022-06-13 MED ORDER — ACETAMINOPHEN 325 MG PO TABS: 650.0000 mg | ORAL_TABLET | Freq: Four times a day (QID) | ORAL | Status: DC | PRN

## 2022-06-13 MED ORDER — TRAZODONE HCL 50 MG PO TABS
50.0000 mg | ORAL_TABLET | Freq: Every evening | ORAL | Status: DC | PRN
  Filled 2022-06-13: qty 1

## 2022-06-13 MED ORDER — ALUM & MAG HYDROXIDE-SIMETH 200-200-20 MG/5ML PO SUSP: 30.0000 mL | ORAL | Status: DC | PRN

## 2022-06-13 MED ORDER — HYDROXYZINE HCL 25 MG PO TABS
25.0000 mg | ORAL_TABLET | Freq: Three times a day (TID) | ORAL | Status: DC | PRN
  Filled 2022-06-13: qty 1
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
## 2022-06-14 LAB — COMPREHENSIVE METABOLIC PANEL
ALT: 22 U/L (ref 0–44)
AST: 17 U/L (ref 15–41)
Albumin: 4.2 g/dL (ref 3.5–5.0)
Alkaline Phosphatase: 49 U/L (ref 38–126)
Anion gap: 8 (ref 5–15)
BUN: 14 mg/dL (ref 6–20)
CO2: 27 mmol/L (ref 22–32)
Calcium: 9 mg/dL (ref 8.9–10.3)
Chloride: 104 mmol/L (ref 98–111)
Creatinine, Ser: 0.95 mg/dL (ref 0.61–1.24)
GFR, Estimated: >60 mL/min (ref >60)
Glucose, Bld: 75 mg/dL (ref 70–99)
Potassium: 4.1 mmol/L (ref 3.5–5.1)
Sodium: 139 mmol/L (ref 135–145)
Total Bilirubin: 0.7 mg/dL (ref 0.3–1.2)
Total Protein: 6.7 g/dL (ref 6.5–8.1)

## 2022-06-14 LAB — CBC WITH DIFFERENTIAL/PLATELET
Abs Immature Granulocytes: 0.01 10*3/uL (ref 0.00–0.07)
Basophils Absolute: 0.1 10*3/uL (ref 0.0–0.1)
Basophils Relative: 1 %
Eosinophils Absolute: 0.1 10*3/uL (ref 0.0–0.5)
Eosinophils Relative: 1 %
HCT: 42.5 % (ref 39.0–52.0)
Hemoglobin: 14.2 g/dL (ref 13.0–17.0)
Immature Granulocytes: 0 %
Lymphocytes Relative: 22 %
Lymphs Abs: 1.7 10*3/uL (ref 0.7–4.0)
MCH: 30.6 pg (ref 26.0–34.0)
MCHC: 33.4 g/dL (ref 30.0–36.0)
MCV: 91.6 fL (ref 80.0–100.0)
Monocytes Absolute: 0.5 10*3/uL (ref 0.1–1.0)
Monocytes Relative: 7 %
Neutro Abs: 5.2 10*3/uL (ref 1.7–7.7)
Neutrophils Relative %: 69 %
Platelets: 208 10*3/uL (ref 150–400)
RBC: 4.64 MIL/uL (ref 4.22–5.81)
RDW: 13 % (ref 11.5–15.5)
WBC: 7.5 10*3/uL (ref 4.0–10.5)
nRBC: 0 % (ref 0.0–0.2)

## 2022-06-14 LAB — LIPID PANEL
Cholesterol: 147 mg/dL (ref 0–200)
HDL: 49 mg/dL (ref >40)
LDL Cholesterol: 81 mg/dL (ref 0–99)
Total CHOL/HDL Ratio: 3 RATIO
Triglycerides: 83 mg/dL (ref <150)
VLDL: 17 mg/dL (ref 0–40)

## 2022-06-14 LAB — RESP PANEL BY RT-PCR (FLU A&B, COVID) ARPGX2
Influenza A by PCR: NEGATIVE
Influenza B by PCR: NEGATIVE
SARS Coronavirus 2 by RT PCR: NEGATIVE

## 2022-06-14 LAB — ETHANOL: Alcohol, Ethyl (B): <10 mg/dL (ref <10)

## 2022-06-14 LAB — HEMOGLOBIN A1C
Hgb A1c MFr Bld: 5.2 % (ref 4.8–5.6)
Mean Plasma Glucose: 103 mg/dL

## 2022-06-14 LAB — TSH: TSH: 2.793 u[IU]/mL (ref 0.350–4.500)

## 2022-06-14 MED ORDER — RISPERIDONE 1 MG PO TABS: 1.0000 mg | ORAL_TABLET | Freq: Two times a day (BID) | ORAL | Status: DC | PRN

## 2022-06-14 MED ORDER — HALOPERIDOL LACTATE 5 MG/ML IJ SOLN: 5.0000 mg | Freq: Four times a day (QID) | INTRAMUSCULAR | Status: DC | PRN

## 2022-06-14 MED ORDER — LORAZEPAM 1 MG PO TABS: 1.0000 mg | ORAL_TABLET | Freq: Two times a day (BID) | ORAL | Status: DC | PRN

## 2022-06-14 MED ORDER — BENZTROPINE MESYLATE 1 MG/ML IJ SOLN: 1.0000 mg | Freq: Four times a day (QID) | INTRAMUSCULAR | Status: DC | PRN

## 2022-06-14 MED ORDER — RISPERIDONE 0.5 MG PO TBDP
0.5000 mg | ORAL_TABLET | Freq: Two times a day (BID) | ORAL | Status: DC
  Filled 2022-06-14: qty 1

## 2022-06-14 MED ORDER — LORAZEPAM 2 MG/ML IJ SOLN: 1.0000 mg | Freq: Four times a day (QID) | INTRAMUSCULAR | Status: DC | PRN

## 2022-06-14 MED ORDER — RISPERIDONE 1 MG PO TBDP: 1.0000 mg | ORAL_TABLET | Freq: Two times a day (BID) | ORAL | Status: DC

## 2022-06-14 NOTE — ED Provider Notes (Addendum)
Behavioral Health Progress Note  Date and Time: 06/14/2022 8:15 AM Name: Nathan Mayo MRN:  [id number]  Reason for Admission: Nathan Mayo is a 31 yo m w/ hx of psychoactive substance induced psychosis and anger presenting involuntarily due to aggression and psychosis. IVC petition states medicatio noncompliance, aggression, responding to internal stimuli, property damage, and throwing objects at other family members.   Subjective:   Patient seen and assessed in obs unit. He was just waking up this morning. He is minimal regarding all symptoms. Denies SI/HI/AVH. He denies experiencing depression. Denies experiencing anxiety. Denies manic symptoms. He does endorse vaping nicotine but denies marijuana use despite UDS positive with marijuana.  After confronting this fact, patient abruptly admits "it suggested to do so".  He continues to be minimal and smiling inappropriately throughout assessment.  When asked about reason for admission, he states that he got into a fight with parents about not buying him something from the store and "now I'm here". He later admits that he had thrown objects at walls and at parents. He stated he "had to do this" because "I had to show my parents how I feel". He then begins to perseverate on discharge insisting "I have to go home". He denies need for therapy nor need for medications. He states "I guess I just have to work on staying calm" but offers no ways to do so.  Collateral: Nathan Mayo [id number] Father reports mental health symptoms began 9 years ago.  Father noticed that patient had become fearful of talking with people and showed signs of paranoia.  Brother at that time had suspected that maybe something traumatic happened to him that led to him becoming more distrustful of other people.  During the last year of his high school education, he was moved to a different city so that he would feel more comfortable.  When he went to college, he continued to be paranoid and  there was significant concern for also some substance use.  Patient also was skipping multiple classes and eventually dropped out of college.  Since then, patient has stated home.  Father states that patient has not been able to hold down a job for very long due to him suddenly quitting. Father states patient has never been formally diagnosed with any psychiatric problems but expresses concerns how patient lacks motivation to do anything besides vape nicotine.   Father states that patient has been responding to internal stimuli as well as having conversations with people who are not there including shouting profanities at people that are not present in his room.  Patient has over the past 3 months becoming increasingly aggressive as well as not eating for multiple days straight.  Father states that patient has no social interactions and often keeps to himself. He has been eating less and less over past few months as well and had gone 3 days straight without eating in the past month. More acutely, last night, patient had demanded money from parents which parents were not willing to provide to go buy vape pens. Father denies this reuqest and patient began throwing glasses and objects and both parents. He also began destroying property including TV, walls, and other objects in the house.   Father states that family may have had some mood symptoms regarding psychiatric history but no hx of schizophrenia, bipolar disorder, or thought disorders.   Father feels grave concern regarding patient returning home at this time without further psychiatric evaluation and medications to better control patient's lability. Patient  is unable to return home at this time. Father asks that a family meeting be held to help facilitate treatment and discharge planning when patient is psychiatrically stabilized.    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 No past surgical history on file. Family History: No family history on file. Family Psychiatric  History: unknown Social History:  Social History   Substance and Sexual Activity  Alcohol Use No     Social History   Substance and Sexual Activity  Drug Use No    Social History   Socioeconomic History   Marital status: Single    Spouse name: Not on file   Number of children: Not on file   Years of education: Not on file   Highest education level: Not on file  Occupational History   Not on file  Tobacco Use   Smoking status: Never   Smokeless tobacco: Never  Vaping Use   Vaping Use: Every day  Substance and Sexual Activity   Alcohol use: No   Drug use: No   Sexual activity: Never  Other Topics Concern   Not on file  Social History Narrative   Not on file   Social Determinants of Health   Financial Resource Strain: Not on file  Food Insecurity: Not on file  Transportation Needs: Not on file  Physical Activity: Not on file  Stress: Not on file  Social Connections: Not on file   SDOH:  SDOH Screenings   Depression (PHQ2-9): Low Risk  (11/29/2021)  Tobacco Use: Low Risk  (11/28/2021)   Additional Social History:    Pain Medications: See MAR Prescriptions: See MAR Over the Counter: See MAR History of alcohol / drug use?: No history of alcohol / drug abuse Longest period of sobriety (when/how long): N/A Negative Consequences of Use:  (N/A) Withdrawal Symptoms:  (N/A)                    Sleep: Fair  Appetite:  Fair  Current Medications:  Current Facility-Administered Medications  Medication Dose Route Frequency Provider Last Rate Last Admin   acetaminophen (TYLENOL) tablet 650 mg  650 mg Oral Q6H PRN Ajibola, Ene A, NP       alum & mag hydroxide-simeth (MAALOX/MYLANTA) 200-200-20 MG/5ML suspension 30 mL  30 mL Oral Q4H PRN Ajibola, Ene A, NP       hydrOXYzine (ATARAX)  tablet 25 mg  25 mg Oral TID PRN Ajibola, Ene A, NP       magnesium hydroxide (MILK OF MAGNESIA) suspension 30 mL  30 mL Oral Daily PRN Ajibola, Ene A, NP       risperiDONE (RISPERDAL M-TABS) disintegrating tablet 0.5 mg  0.5 mg Oral BID Ajibola, Ene A, NP       traZODone (DESYREL) tablet 50 mg  50 mg Oral QHS PRN Ajibola, Ene A, NP       Current Outpatient Medications  Medication Sig Dispense Refill   LORazepam (ATIVAN) 1 MG tablet Take 1 tablet (1 mg total) by mouth 3 (three) times daily as needed for anxiety. (Patient not taking: Reported on 08/04/2019) 10 tablet 0    Labs  Lab Results:  Admission on 06/13/2022  Component Date Value Ref Range Status   SARSCOV2ONAVIRUS 2 AG 06/13/2022 NEGATIVE  NEGATIVE Final   Comment: (NOTE) SARS-CoV-2 antigen NOT DETECTED.  Negative results are presumptive.  Negative results do not preclude SARS-CoV-2 infection and should not be used as the sole basis for treatment or other patient management decisions, including infection  control decisions, particularly in the presence of clinical signs and  symptoms consistent with COVID-19, or in those who have been in contact with the virus.  Negative results must be combined with clinical observations, patient history, and epidemiological information. The expected result is Negative.  Fact Sheet for Patients: HandmadeRecipes.com.cy  Fact Sheet for Healthcare Providers: FuneralLife.at  This test is not yet approved or cleared by the [place] FDA and  has been authorized for detection and/or diagnosis of SARS-CoV-2 by FDA under an Emergency Use Authorization (EUA).  This EUA will remain in effect (meaning this test can be used) for the duration of  the COV                          ID-19 declaration under Section 564(b)(1) of the Act, 21 U.S.C. section 360bbb-3(b)(1), unless the authorization is terminated or revoked sooner.     SARS Coronavirus 2 by  RT PCR 06/13/2022 NEGATIVE  NEGATIVE Final   Comment: (NOTE) SARS-CoV-2 target nucleic acids are NOT DETECTED.  The SARS-CoV-2 RNA is generally detectable in upper respiratory specimens during the acute phase of infection. The lowest concentration of SARS-CoV-2 viral copies this assay can detect is 138 copies/mL. A negative result does not preclude SARS-Cov-2 infection and should not be used as the sole basis for treatment or other patient management decisions. A negative result may occur with  improper specimen collection/handling, submission of specimen other than nasopharyngeal swab, presence of viral mutation(s) within the areas targeted by this assay, and inadequate number of viral copies(<138 copies/mL). A negative result must be combined with clinical observations, patient history, and epidemiological information. The expected result is Negative.  Fact Sheet for Patients:  EntrepreneurPulse.com.au  Fact Sheet for Healthcare Providers:  IncredibleEmployment.be  This test is no                          t yet approved or cleared by the [place] FDA and  has been authorized for detection and/or diagnosis of SARS-CoV-2 by FDA under an Emergency Use Authorization (EUA). This EUA will remain  in effect (meaning this test can be used) for the duration of the COVID-19 declaration under Section 564(b)(1) of the Act, 21 U.S.C.section 360bbb-3(b)(1), unless the authorization is terminated  or revoked sooner.       Influenza A by PCR 06/13/2022 NEGATIVE  NEGATIVE Final   Influenza B by PCR 06/13/2022 NEGATIVE  NEGATIVE Final   Comment: (NOTE) The Xpert Xpress SARS-CoV-2/FLU/RSV plus assay is intended as an aid in the diagnosis of influenza from Nasopharyngeal swab specimens and should not be used as a sole basis for treatment. Nasal washings and aspirates are unacceptable for Xpert Xpress SARS-CoV-2/FLU/RSV testing.  Fact Sheet for  Patients: EntrepreneurPulse.com.au  Fact Sheet for Healthcare Providers: IncredibleEmployment.be  This test is not yet approved or cleared by the [place] FDA and has been authorized for detection and/or diagnosis of SARS-CoV-2 by FDA under an Emergency Use Authorization (EUA). This EUA will remain in effect (meaning this test can be used) for the duration of the COVID-19 declaration under Section 564(b)(1) of the Act, 21 U.S.C. section 360bbb-3(b)(1), unless the authorization is terminated or revoked.  Performed at [facility], [place] [address]  [place], [place] [address]    WBC 06/13/2022 7.5  4.0 - 10.5 K/uL Final   RBC 06/13/2022 4.64  4.22 - 5.81 MIL/uL Final   Hemoglobin 06/13/2022 14.2  13.0 - 17.0 g/dL Final   HCT 60/45/4098 42.5  39.0 - 52.0 % Final   MCV 06/13/2022 91.6  80.0 - 100.0 fL Final   MCH 06/13/2022 30.6  26.0 - 34.0 pg Final   MCHC 06/13/2022 33.4  30.0 - 36.0 g/dL Final   RDW 11/91/4782 13.0  11.5 - 15.5 % Final   Platelets 06/13/2022 208  150 - 400 K/uL Final   REPEATED TO VERIFY   nRBC 06/13/2022 0.0  0.0 - 0.2 % Final   Neutrophils Relative % 06/13/2022 69  % Final   Neutro Abs 06/13/2022 5.2  1.7 - 7.7 K/uL Final   Lymphocytes Relative 06/13/2022 22  % Final   Lymphs Abs 06/13/2022 1.7  0.7 - 4.0 K/uL Final   Monocytes Relative 06/13/2022 7  % Final   Monocytes Absolute 06/13/2022 0.5  0.1 - 1.0 K/uL Final   Eosinophils Relative 06/13/2022 1  % Final   Eosinophils Absolute 06/13/2022 0.1  0.0 - 0.5 K/uL Final   Basophils Relative 06/13/2022 1  % Final   Basophils Absolute 06/13/2022 0.1  0.0 - 0.1 K/uL Final   Immature Granulocytes 06/13/2022 0  % Final   Abs Immature Granulocytes 06/13/2022 0.01  0.00 - 0.07 K/uL Final   Performed at [facility], [address], [place], [place] [address]   Sodium 06/13/2022 139  135 - 145 mmol/L Final   Potassium 06/13/2022 4.1  3.5 - 5.1 mmol/L Final   Chloride  06/13/2022 104  98 - 111 mmol/L Final   CO2 06/13/2022 27  22 - 32 mmol/L Final   Glucose, Bld 06/13/2022 75  70 - 99 mg/dL Final   Glucose reference range applies only to samples taken after fasting for at least 8 hours.   BUN 06/13/2022 14  6 - 20 mg/dL Final   Creatinine, Ser 06/13/2022 0.95  0.61 - 1.24 mg/dL Final   Calcium 30/86/5784 9.0  8.9 - 10.3 mg/dL Final   Total Protein 69/62/9528 6.7  6.5 - 8.1 g/dL Final   Albumin 41/32/4401 4.2  3.5 - 5.0 g/dL Final   AST 02/72/5366 17  15 - 41 U/L Final   ALT 06/13/2022 22  0 - 44 U/L Final   Alkaline Phosphatase 06/13/2022 49  38 - 126 U/L Final   Total Bilirubin 06/13/2022 0.7  0.3 - 1.2 mg/dL Final   GFR, Estimated 06/13/2022 >60  >60 mL/min Final   Comment: (NOTE) Calculated using the CKD-EPI Creatinine Equation (2021)    Anion gap 06/13/2022 8  5 - 15 Final   Performed at [facility], [address] [place], [place] [address]   Alcohol, Ethyl (B) 06/13/2022 <10  <10 mg/dL Final   Comment: (NOTE) Lowest detectable limit for serum alcohol is 10 mg/dL.  For medical purposes only. Performed at [facility], [address], [place], [place] [address]    POC Amphetamine UR 06/13/2022 None Detected  NONE DETECTED (Cut Off Level 1000 ng/mL) Final   POC Secobarbital (BAR) 06/13/2022 None Detected  NONE DETECTED (Cut Off Level 300 ng/mL) Final   POC Buprenorphine (BUP) 06/13/2022 None Detected  NONE DETECTED (Cut Off Level 10 ng/mL) Final   POC Oxazepam (BZO) 06/13/2022 None Detected  NONE DETECTED (Cut Off Level 300 ng/mL) Final  POC Cocaine UR 06/13/2022 None Detected  NONE DETECTED (Cut Off Level 300 ng/mL) Final   POC Methamphetamine UR 06/13/2022 None Detected  NONE DETECTED (Cut Off Level 1000 ng/mL) Final   POC Morphine 06/13/2022 None Detected  NONE DETECTED (Cut Off Level 300 ng/mL) Final   POC Methadone UR 06/13/2022 None Detected  NONE DETECTED (Cut Off Level 300 ng/mL) Final   POC Oxycodone UR 06/13/2022 None  Detected  NONE DETECTED (Cut Off Level 100 ng/mL) Final   POC Marijuana UR 06/13/2022 Positive (A)  NONE DETECTED (Cut Off Level 50 ng/mL) Final   Cholesterol 06/13/2022 147  0 - 200 mg/dL Final   Triglycerides 06/13/2022 83  <150 mg/dL Final   HDL 06/13/2022 49  >40 mg/dL Final   Total CHOL/HDL Ratio 06/13/2022 3.0  RATIO Final   VLDL 06/13/2022 17  0 - 40 mg/dL Final   LDL Cholesterol 06/13/2022 81  0 - 99 mg/dL Final   Comment:        Total Cholesterol/HDL:CHD Risk Coronary Heart Disease Risk Table                     Men   Women  1/2 Average Risk   3.4   3.3  Average Risk       5.0   4.4  2 X Average Risk   9.6   7.1  3 X Average Risk  23.4   11.0        Use the calculated Patient Ratio above and the CHD Risk Table to determine the patient's CHD Risk.        ATP III CLASSIFICATION (LDL):  <100     mg/dL   Optimal  100-129  mg/dL   Near or Above                    Optimal  130-159  mg/dL   Borderline  160-189  mg/dL   High  >190     mg/dL   Very High Performed at [facility] [address] [place], [place] [address]    TSH 06/13/2022 2.793  0.350 - 4.500 uIU/mL Final   Comment: Performed by a 3rd Generation assay with a functional sensitivity of <=0.01 uIU/mL. Performed at [facility], [place] [address], [place], [place] [address]     Blood Alcohol level:  Lab Results  Component Value Date   ETH <10 06/13/2022   ETH <10 123456    Metabolic Disorder Labs: No results found for: "HGBA1C", "MPG" No results found for: "PROLACTIN" Lab Results  Component Value Date   CHOL 147 06/13/2022   TRIG 83 06/13/2022   HDL 49 06/13/2022   CHOLHDL 3.0 06/13/2022   VLDL 17 06/13/2022   LDLCALC 81 06/13/2022    Therapeutic Lab Levels: No results found for: "LITHIUM" No results found for: "VALPROATE" No results found for: "CBMZ"  Physical Findings   PHQ2-9    Flowsheet Row ED from 11/28/2021 in [facility]  PHQ-2 Total  Score 1  PHQ-9 Total Score 2      Flowsheet Row ED from 06/13/2022 in [facility] ED from 11/28/2021 in [facility] ED from 08/04/2019 in [facility]  C-SSRS RISK CATEGORY No Risk No Risk No Risk        Musculoskeletal  Strength & Muscle Tone: within normal limits Gait & Station: normal Patient leans: N/A  Psychiatric Specialty  Exam  Presentation  General Appearance:  Appropriate for Environment  Eye Contact: Fleeting  Speech: Blocked  Speech Volume: Normal  Handedness: Right   Mood and Affect  Mood: Anxious  Affect: Congruent   Thought Process  Thought Processes: Disorganized  Descriptions of Associations:Intact  Orientation:Full (Time, Place and Person)  Thought Content:Logical  Diagnosis of Schizophrenia or Schizoaffective disorder in past: No  Duration of Psychotic Symptoms: Greater than six months   Hallucinations:Hallucinations: None (patient denies)  Ideas of Reference:None  Suicidal Thoughts:Suicidal Thoughts: No  Homicidal Thoughts:Homicidal Thoughts: No   Sensorium  Memory: Immediate Fair; Recent Fair; Remote Fair  Judgment: Fair  Insight: Poor   Executive Functions  Concentration: Poor  Attention Span: Poor  Recall: [place] of Knowledge: Fair  Language: Fair   Psychomotor Activity  Psychomotor Activity: Psychomotor Activity: Normal   Assets  Assets: Physical Health   Sleep  Sleep: Sleep: Good Number of Hours of Sleep: 8   Nutritional Assessment (For OBS and FBC admissions only) Has the patient had a weight loss or gain of 10 pounds or more in the last 3 months?: No Has the patient had a decrease in food intake/or appetite?: No Does the patient have dental problems?: No Does the patient have eating habits or behaviors that may be indicators of an eating disorder including binging or inducing vomiting?:  No Has the patient recently lost weight without trying?: 0 Has the patient been eating poorly because of a decreased appetite?: 0 Malnutrition Screening Tool Score: 0    Physical Exam  Physical Exam Vitals and nursing note reviewed.  Constitutional:      General: He is not in acute distress.    Appearance: He is well-developed.  HENT:     Head: Normocephalic and atraumatic.  Eyes:     Conjunctiva/sclera: Conjunctivae normal.  Cardiovascular:     Rate and Rhythm: Normal rate and regular rhythm.     Heart sounds: No murmur heard. Pulmonary:     Effort: Pulmonary effort is normal. No respiratory distress.     Breath sounds: Normal breath sounds.  Abdominal:     Palpations: Abdomen is soft.     Tenderness: There is no abdominal tenderness.  Musculoskeletal:        General: No swelling.  Skin:    General: Skin is warm and dry.     Capillary Refill: Capillary refill takes less than 2 seconds.  Neurological:     Mental Status: He is alert.  Psychiatric:        Mood and Affect: Mood normal.    Review of Systems  Respiratory:  Negative for shortness of breath.   Cardiovascular:  Negative for chest pain.  Gastrointestinal:  Negative for abdominal pain, constipation, diarrhea, heartburn, nausea and vomiting.  Neurological:  Negative for headaches.   Blood pressure 121/80, pulse 60, temperature 97.7 F (36.5 C), temperature source Tympanic, resp. rate 18, SpO2 100 %. There is no height or weight on file to calculate BMI.  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 He has many negative symptoms including anhedonia, avolition, flat affect, and is unable to keep a job.  He does have hallucinations and disorganized speech at times but appears to be able to compensate for it well. He appears aloof about his violent behavior which is concerning.   PLAN Daily contact with patient to  assess and evaluate symptoms and progress in treatment and  Medication management -Continue IVC given patient's lack of insight and psychosis -Encourage patient to take risperidone -Will likely require mood stabilizer + antipsychotic based on collateral -Will likely require LAI given medication noncompliance -No documented 1st psychotic episode labs so will likely need workup+head CT/MRI -Patient would benefit from family meeting prior to discharge to family is safe for patient to return home  France Ravens, MD 06/14/2022 8:15 AM

## 2022-06-14 NOTE — ED Notes (Signed)
Pt sleeping at present. No noted distress. Will continue to monitor for safety

## 2022-06-14 NOTE — ED Notes (Signed)
Pt awake/alert oriented x 4. Quiet, in recliner bed but has not slept this shift. He is pleasant but guarded. Denies SI/HI/AVH. No noted distress. Will continue to monitor for safety

## 2022-06-14 NOTE — ED Notes (Signed)
Pt awake. Refused scheduled med.. states " I am not taking any meds that the doctor orders". Pt returned to recliner bed.. No noted distress. Will continue to monitor for safety.

## 2022-06-14 NOTE — ED Notes (Signed)
Pt resting quietly, breathing is even and unlabored.  Pt denies SI, HI, pain and AVH.  Will continue to monitor for safety.  

## 2022-06-14 NOTE — ED Notes (Signed)
Patient to the unit - denies SI, HI or AVH - will continue to monitor for safety

## 2022-06-15 ENCOUNTER — Encounter (HOSPITAL_COMMUNITY): Payer: Self-pay | Admitting: Student

## 2022-06-15 ENCOUNTER — Inpatient Hospital Stay (HOSPITAL_COMMUNITY)
Admission: AD | Admit: 2022-06-15 | Discharge: 2022-06-24 | DRG: 885 | Disposition: A | Discharge status: Home / Self Care | Payer: Federal, State, Local not specified - Other | Source: Intra-hospital | Attending: Psychiatry | Admitting: Psychiatry

## 2022-06-15 ENCOUNTER — Other Ambulatory Visit: Payer: Self-pay

## 2022-06-15 DIAGNOSIS — Z91148 Patient's other noncompliance with medication regimen for other reason: Secondary | ICD-10-CM | POA: Diagnosis not present

## 2022-06-15 DIAGNOSIS — F209 Schizophrenia, unspecified: Principal | ICD-10-CM | POA: Diagnosis present

## 2022-06-15 DIAGNOSIS — G4700 Insomnia, unspecified: Secondary | ICD-10-CM | POA: Diagnosis present

## 2022-06-15 DIAGNOSIS — Z1152 Encounter for screening for COVID-19: Secondary | ICD-10-CM | POA: Diagnosis not present

## 2022-06-15 DIAGNOSIS — F411 Generalized anxiety disorder: Secondary | ICD-10-CM | POA: Diagnosis present

## 2022-06-15 DIAGNOSIS — Z5901 Sheltered homelessness: Secondary | ICD-10-CM | POA: Diagnosis not present

## 2022-06-15 DIAGNOSIS — Z79899 Other long term (current) drug therapy: Secondary | ICD-10-CM

## 2022-06-15 DIAGNOSIS — F39 Unspecified mood [affective] disorder: Principal | ICD-10-CM | POA: Diagnosis present

## 2022-06-15 DIAGNOSIS — F602 Antisocial personality disorder: Secondary | ICD-10-CM | POA: Diagnosis present

## 2022-06-15 DIAGNOSIS — F32A Depression, unspecified: Secondary | ICD-10-CM | POA: Diagnosis present

## 2022-06-15 DIAGNOSIS — F17200 Nicotine dependence, unspecified, uncomplicated: Secondary | ICD-10-CM | POA: Diagnosis present

## 2022-06-15 DIAGNOSIS — F17290 Nicotine dependence, other tobacco product, uncomplicated: Secondary | ICD-10-CM | POA: Diagnosis present

## 2022-06-15 DIAGNOSIS — F1220 Cannabis dependence, uncomplicated: Secondary | ICD-10-CM | POA: Diagnosis present

## 2022-06-15 DIAGNOSIS — Z91199 Patient's noncompliance with other medical treatment and regimen due to unspecified reason: Secondary | ICD-10-CM

## 2022-06-15 DIAGNOSIS — F203 Undifferentiated schizophrenia: Secondary | ICD-10-CM | POA: Diagnosis present

## 2022-06-15 LAB — POC SARS CORONAVIRUS 2 AG: SARSCOV2ONAVIRUS 2 AG: NEGATIVE

## 2022-06-15 MED ORDER — MAGNESIUM HYDROXIDE 400 MG/5ML PO SUSP: 30.0000 mL | Freq: Every day | ORAL | Status: DC | PRN

## 2022-06-15 MED ORDER — RISPERIDONE 1 MG PO TBDP
1.0000 mg | ORAL_TABLET | Freq: Two times a day (BID) | ORAL | Status: DC
  Filled 2022-06-15 (×7): qty 1

## 2022-06-15 MED ORDER — LORAZEPAM 1 MG PO TABS: 1.0000 mg | ORAL_TABLET | Freq: Two times a day (BID) | ORAL | Status: DC | PRN

## 2022-06-15 MED ORDER — RISPERIDONE 1 MG PO TBDP: 1.0000 mg | ORAL_TABLET | Freq: Two times a day (BID) | ORAL | Status: DC | PRN

## 2022-06-15 MED ORDER — RISPERIDONE 1 MG PO TBDP: 1.0000 mg | ORAL_TABLET | Freq: Two times a day (BID) | ORAL | 0 refills | Status: DC

## 2022-06-15 MED ORDER — HALOPERIDOL LACTATE 5 MG/ML IJ SOLN: 5.0000 mg | Freq: Four times a day (QID) | INTRAMUSCULAR | Status: DC | PRN

## 2022-06-15 MED ORDER — ALUM & MAG HYDROXIDE-SIMETH 200-200-20 MG/5ML PO SUSP: 30.0000 mL | ORAL | Status: DC | PRN

## 2022-06-15 MED ORDER — LORAZEPAM 2 MG/ML IJ SOLN: 1.0000 mg | Freq: Four times a day (QID) | INTRAMUSCULAR | Status: DC | PRN

## 2022-06-15 MED ORDER — HYDROXYZINE HCL 25 MG PO TABS: 25.0000 mg | ORAL_TABLET | Freq: Three times a day (TID) | ORAL | Status: DC | PRN

## 2022-06-15 MED ORDER — BENZTROPINE MESYLATE 1 MG/ML IJ SOLN: 1.0000 mg | Freq: Four times a day (QID) | INTRAMUSCULAR | Status: DC | PRN

## 2022-06-15 MED ORDER — ACETAMINOPHEN 325 MG PO TABS: 650.0000 mg | ORAL_TABLET | Freq: Four times a day (QID) | ORAL | Status: DC | PRN
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
## 2022-06-15 NOTE — Progress Notes (Signed)
   06/15/22 1945  Psych Admission Type (Psych Patients Only)  Admission Status Involuntary  Psychosocial Assessment  Patient Complaints None  Eye Contact Fair  Facial Expression Flat  Affect Flat  Speech Logical/coherent  Interaction Minimal  Motor Activity Other (Comment) (pt laying in bed. No report of any ambulation issues)  Appearance/Hygiene Unremarkable  Behavior Characteristics Calm  Mood Suspicious  Thought Process  Coherency WDL  Content WDL  Delusions UTA  Perception WDL  Hallucination None reported or observed  Judgment UTA  Confusion None  Danger to Self  Current suicidal ideation? Denies  Danger to Others  Danger to Others None reported or observed   Progress note   D: Pt seen in bed in his room. Pt denies SI, HI, AVH. Pt rates pain  0/10. Pt rates anxiety  0/10 and depression  0/10. Pt covered up but eye contact fair. Pt refuses to take any medications. Pt not a behavior issue at this time.  No other concerns noted at this time.  A: Pt provided support and encouragement. Scheduled medication refused. Q15 min checks for safety.   R: Pt safe on the unit. Will continue to monitor.

## 2022-06-15 NOTE — ED Notes (Signed)
Pt is in the bed resting. Respirations are even and unlabored. No acute distress noted. Will continue to monitor for safety 

## 2022-06-15 NOTE — ED Notes (Signed)
Report called to Lanora Manis RN at [facility] and faxed IVC paperwork, confirmation received. Three copies of IVC paperwork and emtala placed in envelope for transfer. Will call [employer] for transfer closer to 1500. Pt currently resting on pull out bed/chair in no distress. Refused scheduled morning risperdal. Safety maintained and will continue to monitor.

## 2022-06-15 NOTE — ED Notes (Signed)
Discharge instructions provided and Pt stated understanding. Pt alert, orient and ambulatory prior to d/c from facility. Personal belongings returned from locker number 40. Transportation provided by [facility] to [facility]. Report previously called for [facility]. IVC paperwork given to officer at time of depart. Safety maintained.

## 2022-06-15 NOTE — ED Notes (Signed)
[employer] called for transportation services to [facility]. No ETA provided.

## 2022-06-15 NOTE — ED Notes (Signed)
Patient refused his medications saying "I don't need medications"
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
## 2022-06-15 NOTE — Discharge Instructions (Signed)
You are being transferred to [facility] for higher level of care 

## 2022-06-15 NOTE — Tx Team (Signed)
Initial Treatment Plan 06/15/2022 4:37 PM [name] KHT:[id number]    PATIENT STRESSORS: Financial difficulties   Health problems   Marital or family conflict   Medication change or noncompliance     PATIENT STRENGTHS: Communication skills  Supportive family/friends    PATIENT IDENTIFIED PROBLEMS: "To go home"  Aggressive behaviors  Medication noncompliance  Paranoia  Ineffective coping skills             DISCHARGE CRITERIA:  Adequate post-discharge living arrangements  PRELIMINARY DISCHARGE PLAN: Attend aftercare/continuing care group Outpatient therapy Placement in alternative living arrangements  PATIENT/FAMILY INVOLVEMENT: This treatment plan has been presented to and reviewed with the patient, Nathan Mayo, and/or family member.  The patient and family have been given the opportunity to ask questions and make suggestions.  Clarene Critchley, RN 06/15/2022, 4:37 PM

## 2022-06-15 NOTE — [facility] Group Notes (Signed)
Patient did not attend the Wrap-up group. 

## 2022-06-15 NOTE — Progress Notes (Addendum)
ADDENDUM   Per Laretta Alstrom, LPN, PCR is negative. IVC paperwork has been faxed.  Pt was accepted to [facility] TODAY 06/15/22, pending 15 min covid and IVC paperwork faxed to [phone]. Bed placement: 500-1.  Dx: Schizophreniform/psychosis  Pt meets inpatient criteria per Park Pope, MD  Attending Physician will be Phineas Inches, MD   Report can be called to: -Adult unit: [phone]  Pt can arrive after 1500; pending items  Care Team Notified: [facility] Rona Ravens, RN, Laretta Alstrom, LPN, Park Pope, MD, and [address], LCSWA  [place], [place]  06/15/2022 10:41 AM

## 2022-06-16 ENCOUNTER — Encounter (HOSPITAL_COMMUNITY): Payer: Self-pay

## 2022-06-16 DIAGNOSIS — G4700 Insomnia, unspecified: Secondary | ICD-10-CM | POA: Diagnosis present

## 2022-06-16 DIAGNOSIS — F203 Undifferentiated schizophrenia: Secondary | ICD-10-CM | POA: Diagnosis not present

## 2022-06-16 DIAGNOSIS — F411 Generalized anxiety disorder: Secondary | ICD-10-CM | POA: Diagnosis present

## 2022-06-16 DIAGNOSIS — F1220 Cannabis dependence, uncomplicated: Secondary | ICD-10-CM | POA: Diagnosis present

## 2022-06-16 LAB — LIPID PANEL
Cholesterol: 156 mg/dL (ref 0–200)
HDL: 49 mg/dL (ref >40)
LDL Cholesterol: 97 mg/dL (ref 0–99)
Total CHOL/HDL Ratio: 3.2 RATIO
Triglycerides: 52 mg/dL (ref <150)
VLDL: 10 mg/dL (ref 0–40)

## 2022-06-16 MED ORDER — TRAZODONE HCL 50 MG PO TABS
50.0000 mg | ORAL_TABLET | Freq: Every evening | ORAL | Status: DC | PRN
  Administered 2022-06-16: 50 mg via ORAL

## 2022-06-16 MED ORDER — RISPERIDONE 2 MG PO TBDP
2.0000 mg | ORAL_TABLET | Freq: Every day | ORAL | Status: DC
  Administered 2022-06-16: 2 mg via ORAL
  Filled 2022-06-16 (×3): qty 1

## 2022-06-16 MED ORDER — RISPERIDONE 1 MG PO TBDP
1.0000 mg | ORAL_TABLET | Freq: Every day | ORAL | Status: DC
  Filled 2022-06-16 (×3): qty 1

## 2022-06-16 NOTE — Progress Notes (Signed)
Pt observed in bed on initial contact. Presents with fair eye contact, flat affect, logical speech and is ambulatory with steady gait. Pt reports he slept well last night with good appetite. Denies SI, HI, AVH and pain when assessed. Refused scheduled medications on 3 separate attempts". Guarded, isolative to room majority of this shift. Refused to attend scheduled groups despite multiple prompts. Q 15 minutes safety checks maintained without incident. Support, encouragement and reassurance provided to pt. Verbal education done on current treatment regimen, pt verbalized understanding but continues to refused to refused medication "I know I got angry when my parents refused to give me stuff but I'm not taking no medicine". Tolerates meals and fluids well. Safety maintained on and off unit without outburst.

## 2022-06-16 NOTE — Group Note (Signed)
[facility] LCSW Group Therapy Note  Date/Time: 06/16/2022 @ 1pm  Type of Therapy and Topic:  Group Therapy:  Strengths and Qualities  Participation Level:  Did not attend  Description of Group:    In this group patients will be asked to explore and define the terms strength ans qualities.  Patients will be guided to discuss their thoughts, feelings, and behaviors as to where strengths and qualities originate. Participants will then list some of their strengths and qualities related to each subject topic. This group will be process-oriented, with patients participating in exploration of their own experiences as well as giving and receiving support and challenge from other group members.  Therapeutic Goals: Patient will identify specific strengths related to their personal life. Patient will identify feelings, thoughts, and beliefs about strengths and qualities. Patient will identify ways their strengths have been used. . Patient will identify situations where they have helped others or made someone else happy. .  Summary of Patient Progress Did not attend     Therapeutic Modalities:   Cognitive Behavioral Therapy Solution Focused Therapy Motivational Interviewing Brief Therapy   Hara Milholland, LCSW, LCAS Clincal Social Worker  [facility]   
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
## 2022-06-16 NOTE — Group Note (Signed)
Recreation Therapy Group Note   Group Topic:Anger Management  Group Date: 06/16/2022 Start Time: 1015 End Time: 1045 Facilitators: Sangeeta Youse-McCall, LRT,CTRS Location: [address] Dayroom   Goal Area(s) Addresses:  Patient will identify a situation that makes them angry.   Patient will identify ways they cope with anger.  Group Description:  Anger Thermometer. Patients were to fill in a thermometer rating the top 10 things that make them angry from 1-10.  Patients were to then identify ways they cope with their anger.   Clinical Observations/Individualized Feedback: Unable to do group due to acuity on the unit.    Plan: Continue to engage patient in RT group sessions 2-3x/week.   Daisja Kessinger-McCall, LRT,CTRS 06/16/2022 11:48 AM

## 2022-06-16 NOTE — Plan of Care (Signed)
  Problem: Coping: Goal: Coping ability will improve Outcome: Progressing Goal: Will verbalize feelings Outcome: Progressing   Problem: Role Relationship: Goal: Ability to communicate needs accurately will improve Outcome: Progressing Goal: Ability to interact with others will improve Outcome: Progressing   Problem: Safety: Goal: Ability to redirect hostility and anger into socially appropriate behaviors will improve Outcome: Progressing Goal: Ability to remain free from injury will improve Outcome: Progressing

## 2022-06-16 NOTE — Progress Notes (Signed)
   06/16/22 0534  Sleep  Number of Hours 5.75

## 2022-06-16 NOTE — BH IP Treatment Plan (Signed)
Interdisciplinary Treatment and Diagnostic Plan Update  06/16/2022 Time of Session: 10:00am  Nathan Mayo MRN: [id number]  Principal Diagnosis: Undifferentiated schizophrenia [facility])  Secondary Diagnoses: Principal Problem:   Undifferentiated schizophrenia ([place])   Current Medications:  Current Facility-Administered Medications  Medication Dose Route Frequency Provider Last Rate Last Admin   acetaminophen (TYLENOL) tablet 650 mg  650 mg Oral Q6H PRN France Ravens, MD       alum & mag hydroxide-simeth (MAALOX/MYLANTA) 200-200-20 MG/5ML suspension 30 mL  30 mL Oral Q4H PRN France Ravens, MD       haloperidol lactate (HALDOL) injection 5 mg  5 mg Intramuscular Q6H PRN France Ravens, MD       And   benztropine mesylate (COGENTIN) injection 1 mg  1 mg Intramuscular Q6H PRN France Ravens, MD       And   LORazepam (ATIVAN) injection 1 mg  1 mg Intramuscular Q6H PRN France Ravens, MD       hydrOXYzine (ATARAX) tablet 25 mg  25 mg Oral TID PRN France Ravens, MD       risperiDONE (RISPERDAL M-TABS) disintegrating tablet 1 mg  1 mg Oral BID PRN France Ravens, MD       And   LORazepam (ATIVAN) tablet 1 mg  1 mg Oral BID PRN France Ravens, MD       magnesium hydroxide (MILK OF MAGNESIA) suspension 30 mL  30 mL Oral Daily PRN France Ravens, MD       Derrill Memo ON 06/17/2022] risperiDONE (RISPERDAL M-TABS) disintegrating tablet 1 mg  1 mg Oral Daily Nkwenti, Doris, NP       risperiDONE (RISPERDAL M-TABS) disintegrating tablet 2 mg  2 mg Oral QHS Nkwenti, Doris, NP       PTA Medications: Medications Prior to Admission  Medication Sig Dispense Refill Last Dose   risperiDONE (RISPERDAL M-TABS) 1 MG disintegrating tablet Take 1 tablet (1 mg total) by mouth 2 (two) times daily.  0     Patient Stressors: Financial difficulties   Health problems   Marital or family conflict   Medication change or noncompliance    Patient Strengths: Armed forces logistics/support/administrative officer  Supportive family/friends   Treatment Modalities: Medication Management, Group  therapy, Case management,  1 to 1 session with clinician, Psychoeducation, Recreational therapy.   Physician Treatment Plan for Primary Diagnosis: Undifferentiated schizophrenia ([place]) Long Term Goal(s):     Short Term Goals:    Medication Management: Evaluate patient's response, side effects, and tolerance of medication regimen.  Therapeutic Interventions: 1 to 1 sessions, Unit Group sessions and Medication administration.  Evaluation of Outcomes: Not Met  Physician Treatment Plan for Secondary Diagnosis: Principal Problem:   Undifferentiated schizophrenia ([place])  Long Term Goal(s):     Short Term Goals:       Medication Management: Evaluate patient's response, side effects, and tolerance of medication regimen.  Therapeutic Interventions: 1 to 1 sessions, Unit Group sessions and Medication administration.  Evaluation of Outcomes: Not Met   RN Treatment Plan for Primary Diagnosis: Undifferentiated schizophrenia ([place]) Long Term Goal(s): Knowledge of disease and therapeutic regimen to maintain health will improve  Short Term Goals: Ability to remain free from injury will improve, Ability to participate in decision making will improve, Ability to verbalize feelings will improve, Ability to disclose and discuss suicidal ideas, and Ability to identify and develop effective coping behaviors will improve  Medication Management: RN will administer medications as ordered by provider, will assess and evaluate patient's response and provide education to patient for prescribed  medication. RN will report any adverse and/or side effects to prescribing provider.  Therapeutic Interventions: 1 on 1 counseling sessions, Psychoeducation, Medication administration, Evaluate responses to treatment, Monitor vital signs and CBGs as ordered, Perform/monitor CIWA, COWS, AIMS and Fall Risk screenings as ordered, Perform wound care treatments as ordered.  Evaluation of Outcomes: Not Met   LCSW Treatment  Plan for Primary Diagnosis: Undifferentiated schizophrenia ([place]) Long Term Goal(s): Safe transition to appropriate next level of care at discharge, Engage patient in therapeutic group addressing interpersonal concerns.  Short Term Goals: Engage patient in aftercare planning with referrals and resources, Increase social support, Increase emotional regulation, Facilitate acceptance of mental health diagnosis and concerns, Identify triggers associated with mental health/substance abuse issues, and Increase skills for wellness and recovery  Therapeutic Interventions: Assess for all discharge needs, 1 to 1 time with Social worker, Explore available resources and support systems, Assess for adequacy in community support network, Educate family and significant other(s) on suicide prevention, Complete Psychosocial Assessment, Interpersonal group therapy.  Evaluation of Outcomes: Not Met   Progress in Treatment: Attending groups: Yes. Participating in groups: Yes. Taking medication as prescribed: Yes. Toleration medication: Yes. Family/Significant other contact made: Yes, individual(s) contacted:  Father Patient understands diagnosis: No. Discussing patient identified problems/goals with staff: Yes. Medical problems stabilized or resolved: Yes. Denies suicidal/homicidal ideation: Yes. Issues/concerns per patient self-inventory: No.   New problem(s) identified: No, Describe:  None   New Short Term/Long Term Goal(s): medication stabilization, elimination of SI thoughts, development of comprehensive mental wellness plan.   Patient Goals: "To go back home"   Discharge Plan or Barriers: Patient recently admitted. CSW will continue to follow and assess for appropriate referrals and possible discharge planning.   Reason for Continuation of Hospitalization: Depression Hallucinations Medication stabilization  Estimated Length of Stay: 3 to 7 days   Last 3 [place] Suicide Severity Risk  Score: [place] Admission (Current) from 06/15/2022 in [place] [facility] ED from 06/13/2022 in [facility] ED from 11/28/2021 in [place] DEPT  C-SSRS RISK CATEGORY No Risk No Risk No Risk       Last PHQ 2/9 Scores:    11/29/2021    1:11 AM  Depression screen PHQ 2/9  Decreased Interest 1  Down, Depressed, Hopeless 0  PHQ - 2 Score 1  Altered sleeping 0  Tired, decreased energy 0  Change in appetite 0  Feeling bad or failure about yourself  0  Trouble concentrating 1  Moving slowly or fidgety/restless 0  Suicidal thoughts 0  PHQ-9 Score 2  Difficult doing work/chores Not difficult at all    Scribe for Treatment Team: Darleen Crocker, [place] 06/16/2022 2:10 PM

## 2022-06-16 NOTE — BHH Counselor (Signed)
Adult Comprehensive Assessment  Patient ID: Nathan Mayo, male   DOB: Nov 15, 1990, 31 y.o.   MRN: [id number]  Information Source: Information source: Patient  Current Stressors:  Patient states their primary concerns and needs for treatment are:: Patient states that he got into an argument with his parents Patient states their goals for this hospitilization and ongoing recovery are:: Patient states that his goal is to go home Educational / Learning stressors: NA Employment / Job issues: NA Family Relationships: NA Museum/gallery curator / Lack of resources (include bankruptcy): NA Housing / Lack of housing: NA Physical health (include injuries & life threatening diseases): NA Social relationships: NA Substance abuse: NA Bereavement / Loss: NA  Living/Environment/Situation:  Living Arrangements: Parent Living conditions (as described by patient or guardian): Patient states that his living condition is good Who else lives in the home?: Patient parents How long has patient lived in current situation?: Patient states all my life What is atmosphere in current home: Comfortable, Supportive  Family History:  Marital status: Single Are you sexually active?: No What is your sexual orientation?: Straight Has your sexual activity been affected by drugs, alcohol, medication, or emotional stress?: DNA Does patient have children?: No  Childhood History:  By whom was/is the patient raised?: Both parents Description of patient's relationship with caregiver when they were a child: Patient states that his relationship with his parents as a child was "good" Patient's description of current relationship with people who raised him/her: Patient states that his current relationship with his parents is "fair" How were you disciplined when you got in trouble as a child/adolescent?: Patient states that he was not disciplined Does patient have siblings?: No Did patient suffer any verbal/emotional/physical/sexual abuse as  a child?: No Did patient suffer from severe childhood neglect?: No Has patient ever been sexually abused/assaulted/raped as an adolescent or adult?: No Was the patient ever a victim of a crime or a disaster?: No Has patient been affected by domestic violence as an adult?: No  Education:  Highest grade of school patient has completed: Patient states the highes grade level he has completed was high school Currently a student?: No Learning disability?: No  Employment/Work Situation:   Employment Situation: Unemployed Patient's Job has Been Impacted by Current Illness: No What is the Longest Time Patient has Held a Job?: Patient states that he has never worked Where was the Patient Employed at that Time?: NA Has Patient ever Been in the [employer]?: No  Financial Resources:   Museum/gallery curator resources: Support from parents / caregiver Does patient have a Programmer, applications or guardian?: No  Alcohol/Substance Abuse:   What has been your use of drugs/alcohol within the last 12 months?: Patient denies using any drugs or alcohol If attempted suicide, did drugs/alcohol play a role in this?: No If yes, describe treatment: NA Has alcohol/substance abuse ever caused legal problems?: No  Social Support System:   Pensions consultant Support System: Fair Astronomer System: Patient states his parents supports him , pays his phone bill Type of faith/religion: Patient denies any faith/religion How does patient's faith help to cope with current illness?: NA  Leisure/Recreation:   Do You Have Hobbies?: No Leisure and Hobbies: Patient states "no"  Strengths/Needs:   What is the patient's perception of their strengths?: Patient states "no" Patient states they can use these personal strengths during their treatment to contribute to their recovery: Patient states "no" Patient states these barriers may affect/interfere with their treatment: Patient states "no" Patient states these barriers  may  affect their return to the community: Patient states "no" Other important information patient would like considered in planning for their treatment: Patient states "no"  Discharge Plan:   Currently receiving community mental health services: No Patient states concerns and preferences for aftercare planning are: Patient states "no" Patient states they will know when they are safe and ready for discharge when: DNA Does patient have access to transportation?: Yes Does patient have financial barriers related to discharge medications?: No Will patient be returning to same living situation after discharge?: Yes  Summary/Recommendations:   Summary and Recommendations (to be completed by the evaluator): Nathan Mayo is a 31 year old male with a history of acute psychosis and aggressive behavior. Nathan Mayo states that he got into an argument with his parents and does not know why he is here. Patient declines seeing any outside providers and does not want any appointments for therapy of med management. Per patient, " I do not need therapy or medications". Also, patient states that he does not have any current stressors at this time just ready to go home.While here, Nathan Mayo can benefit from crisis stabilization, medication management, therapeutic milieu, and referrals for services.   Isabella Bowens. 06/16/2022

## 2022-06-16 NOTE — Progress Notes (Signed)
Pt originally refused medication, pt educated on IVC status and possible forced medications, pt agreed to try the medications this evening,  pt given Risperdal and Trazodone per [facility]
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
## 2022-06-17 MED ORDER — BENZTROPINE MESYLATE 1 MG/ML IJ SOLN
1.0000 mg | Freq: Two times a day (BID) | INTRAMUSCULAR | Status: DC
  Filled 2022-06-17 (×16): qty 1
  Filled 2022-06-17: qty 2
  Filled 2022-06-17 (×2): qty 1

## 2022-06-17 MED ORDER — HALOPERIDOL LACTATE 5 MG/ML IJ SOLN
2.0000 mg | Freq: Every morning | INTRAMUSCULAR | Status: DC
  Administered 2022-06-17: 2 mg via INTRAMUSCULAR
  Filled 2022-06-17 (×2): qty 0.4
  Filled 2022-06-17: qty 1
  Filled 2022-06-17 (×2): qty 0.4

## 2022-06-17 MED ORDER — HALOPERIDOL 5 MG PO TABS
5.0000 mg | ORAL_TABLET | Freq: Every day | ORAL | Status: DC
  Administered 2022-06-17 – 2022-06-18 (×2): 5 mg via ORAL
  Filled 2022-06-17 (×3): qty 1

## 2022-06-17 MED ORDER — BENZTROPINE MESYLATE 1 MG PO TABS
1.0000 mg | ORAL_TABLET | Freq: Two times a day (BID) | ORAL | Status: DC
  Administered 2022-06-17 – 2022-06-24 (×14): 1 mg via ORAL
  Filled 2022-06-17 (×17): qty 1
  Filled 2022-06-17 (×2): qty 20

## 2022-06-17 MED ORDER — HALOPERIDOL 2 MG PO TABS
2.0000 mg | ORAL_TABLET | Freq: Every morning | ORAL | Status: DC
  Administered 2022-06-18 – 2022-06-19 (×2): 2 mg via ORAL
  Filled 2022-06-17 (×4): qty 1

## 2022-06-17 MED ORDER — HALOPERIDOL LACTATE 5 MG/ML IJ SOLN
5.0000 mg | Freq: Every day | INTRAMUSCULAR | Status: DC
  Filled 2022-06-17 (×3): qty 1

## 2022-06-17 NOTE — Progress Notes (Signed)
Observed patient laying in bed after wrap-up group, patient educated and reminded of IVC status and forced medication order, patient demonstrated understanding and agreed to take PO medications in AM. Patient denies SI, HI, and AVH.  Q 15 minutes checks continues, safety maintained.

## 2022-06-17 NOTE — [facility] Group Notes (Signed)
Child/Adolescent Psychoeducational Group Note  Date:  06/17/2022 Time:  2:37 PM  Group Topic/Focus:  Goals Group:   The focus of this group is to help patients establish daily goals to achieve during treatment and discuss how the patient can incorporate goal setting into their daily lives to aide in recovery.  Additional Comments:  Patient didn't attended goals group. Patient was informed that group was starting multiple times but didn't attend.   Jasmynn Pfalzgraf T Lorraine Lax 06/17/2022, 2:37 PM

## 2022-06-17 NOTE — Plan of Care (Signed)
  Problem: Education: Goal: Knowledge of [facility] Education information/materials will improve Outcome: Progressing   Problem: Education: Goal: Mental status will improve Outcome: Progressing   Problem: Activity: Goal: Sleeping patterns will improve Outcome: Progressing   Problem: Health Behavior/Discharge Planning: Goal: Compliance with treatment plan for underlying cause of condition will improve Outcome: Progressing   Problem: Safety: Goal: Periods of time without injury will increase Outcome: Progressing   Problem: Education: Goal: Knowledge of the prescribed therapeutic regimen will improve Outcome: Progressing   Problem: Safety: Goal: Ability to remain free from injury will improve Outcome: Progressing
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
## 2022-06-17 NOTE — [facility] Group Notes (Signed)
Pt attended wrap-up group.  Pt rated day 5 out of 10. Patient said that he was happy about getting quality wrap.

## 2022-06-17 NOTE — Group Note (Signed)
Recreation Therapy Group Note   Group Topic:Problem Solving  Group Date: 06/17/2022 Start Time: 1000 End Time: 1040 Facilitators: Afton Mikelson-McCall, LRT,CTRS Location: 500 Hall Dayroom   Goal Area(s) Addresses:  Patient will effectively work in a team with other group members. Patient will verbalize importance of using appropriate problem solving techniques.  Patient will identify positive change associated with effective problem solving skills.   Group Description: Brain Teasers.  Patients were given two sheets of brain teasers.  Patients were to work together to figure out what each puzzle meant.  Once patients figured out what the puzzle meant, they were to write on the line under the picture.  Patients were given 20 minutes to complete the activity.  LRT and patients would go over the teasers when the time was up.     Affect/Mood: N/A   Participation Level: Did not attend    Clinical Observations/Individualized Feedback:      Plan: Continue to engage patient in RT group sessions 2-3x/week.   Chivonne Rascon-McCall, LRT,CTRS 06/17/2022 12:28 PM

## 2022-06-17 NOTE — Progress Notes (Signed)
   06/17/22 1115  Psych Admission Type (Psych Patients Only)  Admission Status Involuntary  Psychosocial Assessment  Patient Complaints Anxiety;Irritability  Eye Contact Intense  Facial Expression Flat  Affect Blunted;Angry;Irritable  Speech Logical/coherent;Pressured  Interaction Hypervigilant;Defensive  Motor Activity Slow  Appearance/Hygiene In scrubs  Behavior Characteristics Unwilling to participate  Mood Labile;Preoccupied  Thought Process  Coherency Circumstantial  Content Blaming others;Paranoia  Delusions Paranoid;Somatic  Perception WDL  Hallucination None reported or observed  Judgment Impaired  Confusion None  Danger to Self  Current suicidal ideation? Denies  Agreement Not to Harm Self Yes  Description of Agreement Verbal contract  Danger to Others  Danger to Others None reported or observed   Pt remains resistive to care. Force medications initiated as order this shift. Pt continues to refuse his medications as order "I'm not taking no more medicines. It's killing me inside, I can't even breathe, my stomach hurts. I can feel my body right now. You guys are killing me". Isolates in his room, still guarded but is very somatic, hypervigilant with pressured speech, irritability and hostility on interactions in his protest to stop the medications. Requires multiple verbal redirections and prompts with show of support at every encounter to ensure medication compliance. Safety checks maintained at Q 15 minutes intervals. Support, encouragement and reassurance offer to pt this shift. Verbal education done on current treatment regimen and effects monitored. Pt tolerates meals, medications and fluids well. EKG and vitals done, documented. Pt continues to refuse groups despite multiple prompts. Pt went off unit for supper, returned without issues.

## 2022-06-17 NOTE — Progress Notes (Signed)
Recreation Therapy Notes  INPATIENT RECREATION THERAPY ASSESSMENT  Patient Details Name: Nathan Mayo MRN: [id number] DOB: Jul 29, 1990 Today's Date: 06/17/2022       Information Obtained From: Patient  Able to Participate in Assessment/Interview: Yes  Patient Presentation: Alert (Eyes closed)  Reason for Admission (Per Patient): Med Non-Compliance, Other (Comments) (Per chart: aggressive behavior; paranoia)  Patient Stressors:  (None)  Coping Skills:   Isolation, TV, Sports, Music, Exercise, Meditate, Deep Breathing, Talk, Prayer, Avoidance, Read, Hot Bath/Shower  Leisure Interests (2+):  Sports - Basketball, Music - Listen, Individual - Reading, Individual - TV  Frequency of Recreation/Participation: Weekly  Awareness of Community Resources:  Yes  Community Resources:  Restaurants, [place], [place]  Current Use: Yes  If no, Barriers?:    Expressed Interest in [employer] Information: No  [place]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

## 2022-06-17 NOTE — Progress Notes (Signed)
[facility] Second Physician Opinion Progress Note for Medication Administration to Non-consenting Patients (For Involuntarily Committed Patients)  Patient: Nathan Mayo Date of Birth: [birth year] MRN: [id number]   Reason for the Medication: The patient, without the benefit of the specific treatment measure, is incapable of participating in any available treatment plan that will give the patient a realistic opportunity of improving the patient's condition.   Consideration of Side Effects: Consideration of the side effects related to the medication plan has been given.   Rationale for Medication Administration: At the present time, patient has no capacity to understand the need for treatment, refusing to take psychotropic medication, because of poor insight and judgment.  Patient states that they have no problem, no mental illness and refuses to consider taking medication, recommended by the psychiatrist Dr. Adaline Sill.  Diagnosis: Unspecified psychotic disorder vs schizophrenia    Treatment plan/Opinion:  -patient is mentally ill, as needs hospitalization -patient continues to display active symptoms of psychosis -patient is unable to rationally discuss medication options, risks versus benefit due to their symptoms. -treatment with medication and medication changes would be in their best interest -psychotropics are effective treatment for symptoms of psychosis   Based on my evaluation, the following medications are recommend and indicated for treatment of the patient's psychiatric diagnosis and symptoms: Antipsychotic medication (first or second generation)    Cristy Hilts, MD 06/17/22  10:00 AM   This documentation is good for (7) seven days from the date of the MD signature. New documentation must be completed every seven (7) days with detailed justification in the medical record if the patient requires continued non-emergent administration of psychotropic medications.

## 2022-06-18 NOTE — [facility] Group Notes (Signed)
Patient did not attend the Wrap-up group. 

## 2022-06-18 NOTE — Group Note (Signed)
LCSW Group Therapy Note   Group Date: 06/18/2022 Start Time: 1100 End Time: 1200  Type of Therapy and Topic:  Group Therapy: Are You Under Stress?!?!  Participation Level:  Did Not Attend  Description of Group: This process group involved patients examining stress and how it impacts their lives. Distress and eustress definitions were explained and patients identified both good and bad stressors in their lives. Accompanying worksheet was used to help patients identify the physical and emotional symptoms of stress and techniques/copings skills they utilize to help reduce these symptoms.    Therapeutic Goals:  1.  Patients will talk about their experiences with distress and eustress. 2. Patients will identify stressors in their lives and the physical and emotional  symptoms they experience while under stress. 3. Patients to identify actions/coping skills that they utilize to help ameliorate  symptoms of stress.  Summary of Patient Progress:  Did not attend    Therapeutic Modalities:  Cognitive Behavioral Therapy Solution-Focused Therapy  Isabella Bowens LCSW 06/18/2022 2:33 PM
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
## 2022-06-18 NOTE — Progress Notes (Signed)
   06/18/22 2203  Psych Admission Type (Psych Patients Only)  Admission Status Involuntary  Psychosocial Assessment  Patient Complaints Anxiety;Isolation  Eye Contact Fair  Facial Expression Flat;Anxious  Affect Appropriate to circumstance;Anxious  Speech Logical/coherent;Pressured  Interaction Assertive  Motor Activity Slow  Appearance/Hygiene In scrubs  Behavior Characteristics Cooperative;Appropriate to situation;Anxious  Mood Anxious;Pleasant  Thought Process  Coherency Circumstantial  Content WDL  Delusions None reported or observed  Perception WDL  Hallucination None reported or observed  Judgment WDL  Confusion None  Danger to Self  Current suicidal ideation? Denies

## 2022-06-18 NOTE — Progress Notes (Signed)
Patient did not attend morning orientation group.  

## 2022-06-18 NOTE — Progress Notes (Signed)
   06/18/22 1108  Psych Admission Type (Psych Patients Only)  Admission Status Involuntary  Psychosocial Assessment  Patient Complaints Isolation;Other (Comment) ("I need to leave from here")  Eye Contact Fair  Facial Expression Flat  Affect Appropriate to circumstance  Speech Logical/coherent;Pressured  Interaction Assertive  Motor Activity Slow  Appearance/Hygiene In scrubs  Behavior Characteristics Cooperative  Mood Labile;Preoccupied  Aggressive Behavior  Effect No apparent injury  Thought Process  Coherency Circumstantial  Content WDL  Delusions None reported or observed  Perception WDL  Hallucination None reported or observed  Judgment WDL  Confusion WDL  Danger to Self  Current suicidal ideation? Denies  Agreement Not to Harm Self Yes  Description of Agreement Verbal contract  Danger to Others  Danger to Others None reported or observed   Took his medications as scheduled with minimal prompts this shift with force med reminders. Off unit for meals with peers, returned without issues. Remains guarded, isolative to room without incident this shift. Safety maintained at Q 15 minutes intervals without outburst.

## 2022-06-18 NOTE — Group Note (Signed)
Recreation Therapy Group Note   Group Topic:Self-Esteem  Group Date: 06/18/2022 Start Time: 1005 End Time: 1025 Facilitators: Venera Privott-McCall, LRT,CTRS Location: 500 Hall Dayroom   Goal Area(s) Addresses:  Patient will successfully identify positive attributes about themselves.  Patient will identify healthy ways to increase self-esteem. Patient will acknowledge benefit(s) of improved self-esteem.   Group Description:  My Strengths and Qualities.  LRT discussed what self esteem is and why it is important.  Patients were then given a worksheet where they were to identify things they are good at, what they like about their appearance, how they've helped others, what they value, compliments received, challenges overcome, things that make them unique and times they've made others happy.   Affect/Mood: N/A   Participation Level: Did not attend    Clinical Observations/Individualized Feedback:      Plan: Continue to engage patient in RT group sessions 2-3x/week.   Sianne Tejada-McCall, LRT,CTRS 06/18/2022 11:04 AM

## 2022-06-19 MED ORDER — HALOPERIDOL LACTATE 5 MG/ML IJ SOLN
7.5000 mg | Freq: Every day | INTRAMUSCULAR | Status: DC
  Filled 2022-06-19 (×7): qty 1.5

## 2022-06-19 MED ORDER — HALOPERIDOL 5 MG PO TABS: 5.0000 mg | ORAL_TABLET | Freq: Every morning | ORAL | Status: DC

## 2022-06-19 MED ORDER — HALOPERIDOL LACTATE 5 MG/ML IJ SOLN: 5.0000 mg | Freq: Every morning | INTRAMUSCULAR | Status: DC

## 2022-06-19 MED ORDER — HALOPERIDOL 5 MG PO TABS: 7.5000 mg | ORAL_TABLET | Freq: Every morning | ORAL | Status: DC

## 2022-06-19 MED ORDER — HALOPERIDOL 5 MG PO TABS
5.0000 mg | ORAL_TABLET | Freq: Every morning | ORAL | Status: DC
  Administered 2022-06-20 – 2022-06-24 (×5): 5 mg via ORAL
  Filled 2022-06-19 (×2): qty 25
  Filled 2022-06-19 (×6): qty 1

## 2022-06-19 MED ORDER — HALOPERIDOL LACTATE 5 MG/ML IJ SOLN: 7.5000 mg | Freq: Every morning | INTRAMUSCULAR | Status: DC

## 2022-06-19 MED ORDER — HALOPERIDOL LACTATE 5 MG/ML IJ SOLN
5.0000 mg | Freq: Every morning | INTRAMUSCULAR | Status: DC
  Filled 2022-06-19 (×7): qty 1

## 2022-06-19 MED ORDER — HALOPERIDOL 5 MG PO TABS
7.5000 mg | ORAL_TABLET | Freq: Every day | ORAL | Status: DC
  Administered 2022-06-19 – 2022-06-23 (×5): 7.5 mg via ORAL
  Filled 2022-06-19 (×7): qty 1

## 2022-06-19 NOTE — Progress Notes (Addendum)
Pt is A&OX4, calm, remains isolative, flat, denies feelings of hopelessness, denies suicidal ideations, denies homicidal ideations, denies auditory hallucinations and denies visual hallucinations. Pt verbally agrees to approach staff if these become apparent and before harming self or others. Pt denies experiencing nightmares. Mood and affect are congruent. Pt appetite is ok. No complaints of anxiety, distress, pain and/or discomfort at this time. Pt's memory appears to be grossly intact, and Pt hasn't displayed any injurious behaviors. Pt is medication compliant. There's no evidence of suicidal intent. Psychomotor activity was WNL. No s/s of Parkinson, Dystonia, Akathisia and/or Tardive Dyskinesia noted.

## 2022-06-19 NOTE — [facility] Group Notes (Signed)
Patient did not attend group.

## 2022-06-19 NOTE — Group Note (Signed)
[facility] Group Notes: (Clinical Social Work)   06/19/2022      Type of Therapy:  Group Therapy   Participation Level:  Did Not Attend - was invited both individually by MHT and by overhead announcement, chose not to attend.   Ambrose Mantle, LCSW 06/19/2022, 2:06 PM

## 2022-06-19 NOTE — [facility] Group Notes (Signed)
Adult Psychoeducational Group Note  Date:  06/19/2022 Time:  8:12 PM  Group Topic/Focus:  Coping With Mental Health Crisis:   The purpose of this group is to help patients identify strategies for coping with mental health crisis.  Group discusses possible causes of crisis and ways to manage them effectively.  Participation Level:  Minimal  Participation Quality:  Drowsy  Affect:  Appropriate  Cognitive:  Confused  Insight: Limited  Engagement in Group:  Limited  Modes of Intervention:  Discussion  Additional Comments  SHANA YOUNGE 06/19/2022, 8:12 PM

## 2022-06-19 NOTE — Progress Notes (Signed)
   06/19/22 2122  Psych Admission Type (Psych Patients Only)  Admission Status Involuntary  Psychosocial Assessment  Patient Complaints Other (Comment) (pt c/o having to take medication, needs much encouragement)  Eye Contact Fair  Facial Expression Flat;Anxious  Affect Appropriate to circumstance;Anxious  Speech Logical/coherent;Pressured  Interaction Assertive  Motor Activity Slow  Appearance/Hygiene In scrubs  Behavior Characteristics Appropriate to situation  Mood Pleasant;Preoccupied  Thought Process  Coherency Circumstantial  Content WDL  Delusions None reported or observed  Perception WDL  Hallucination None reported or observed  Judgment WDL  Confusion None  Danger to Self  Current suicidal ideation? Denies  Agreement Not to Harm Self Yes  Description of Agreement verbally contracts  Danger to Others  Danger to Others None reported or observed
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
## 2022-06-20 MED ORDER — VENLAFAXINE HCL ER 75 MG PO CP24
75.0000 mg | ORAL_CAPSULE | Freq: Every day | ORAL | Status: DC
  Filled 2022-06-20: qty 1

## 2022-06-20 MED ORDER — VENLAFAXINE HCL ER 37.5 MG PO CP24
37.5000 mg | ORAL_CAPSULE | Freq: Every day | ORAL | Status: AC
  Administered 2022-06-21: 37.5 mg via ORAL
  Filled 2022-06-20: qty 1

## 2022-06-20 NOTE — Progress Notes (Signed)
   06/20/22 0900  Psych Admission Type (Psych Patients Only)  Admission Status Involuntary  Psychosocial Assessment  Patient Complaints None  Eye Contact Fair  Facial Expression Flat;Anxious  Affect Appropriate to circumstance  Speech Logical/coherent;Pressured  Interaction Assertive  Motor Activity Slow  Appearance/Hygiene In scrubs  Behavior Characteristics Cooperative;Appropriate to situation  Mood Preoccupied;Pleasant  Aggressive Behavior  Effect No apparent injury  Thought Process  Coherency Circumstantial  Content WDL  Delusions None reported or observed  Perception WDL  Hallucination None reported or observed  Judgment WDL  Confusion None  Danger to Self  Current suicidal ideation? Denies  Agreement Not to Harm Self Yes  Description of Agreement Verbal  Danger to Others  Danger to Others None reported or observed

## 2022-06-20 NOTE — Group Note (Signed)
[facility] LCSW Group Therapy Note  Date/Time:    06/20/2022   Type of Therapy and Topic:  Group Therapy:  Shame and its Impact on My Life  Participation Level:  Did Not Attend   Description of Group:  The focus of this group was to examine our tendency to be hyper-critical of self and how this leads to feelings of worthlessness, hopelessness, and shame.  Patients were guided to the concept that shame is universal and is worsened by being kept hidden, but improved by being revealed.  We discussed how feeling unworthy is the result of shame and discussed the differences between guilt  ("I did something bad" "I made a mistake" "I did something stupid") and shame ("I am bad" "I am a mistake" "I am stupid") .  We discussed that feelings are not necessarily based in facts.  We also talked about what happens when we do something to numb our negative feelings and how that actually numbs our positive feelings at the same time.  Therapeutic Goals Identify statements patients automatically say to themselves, "I'll be worthy when...."  Examine how this is unkind to ourselves because it indicates we cannot be worthy until some far-reaching, possibly even unreachable, goal is achieved. Talk about the frequent use of unhealthy coping skills used when feeling unworthy Practice 4 coping skills briefly (5 senses mindfulness, Camera Zoom In and Out, Grounding, 5-finger breathing) Allow patients to discuss their shame out loud in order to reduce its power over them  Summary of Patient Progress: Patient was invited to group, did not attend.   Therapeutic Modalities Processing  Nathan Leggio Grossman-Orr, LCSW 06/20/2022, 12:30 PM    

## 2022-06-20 NOTE — [facility] Group Notes (Signed)
Pt did not attend wrap-up group   

## 2022-06-20 NOTE — Progress Notes (Signed)
   06/20/22 2010  Psych Admission Type (Psych Patients Only)  Admission Status Involuntary  Psychosocial Assessment  Patient Complaints None  Eye Contact Fair  Facial Expression Flat;Anxious  Affect Appropriate to circumstance;Anxious  Speech Logical/coherent;Pressured  Interaction Assertive  Motor Activity Slow  Appearance/Hygiene Improved  Behavior Characteristics Cooperative;Appropriate to situation  Mood Pleasant  Thought Process  Coherency Circumstantial  Content WDL  Delusions None reported or observed  Perception WDL  Hallucination None reported or observed  Judgment WDL  Confusion None  Danger to Self  Current suicidal ideation? Denies  Agreement Not to Harm Self Yes  Description of Agreement verbally contracts  Danger to Others  Danger to Others None reported or observed
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
## 2022-06-21 ENCOUNTER — Encounter (HOSPITAL_COMMUNITY): Payer: Self-pay

## 2022-06-21 MED ORDER — DIVALPROEX SODIUM ER 500 MG PO TB24
500.0000 mg | ORAL_TABLET | Freq: Every day | ORAL | Status: DC
  Administered 2022-06-21 – 2022-06-24 (×4): 500 mg via ORAL
  Filled 2022-06-21 (×3): qty 1
  Filled 2022-06-21: qty 10
  Filled 2022-06-21 (×3): qty 1

## 2022-06-21 MED ORDER — VENLAFAXINE HCL ER 37.5 MG PO CP24
37.5000 mg | ORAL_CAPSULE | Freq: Every day | ORAL | Status: DC
  Administered 2022-06-22 – 2022-06-24 (×3): 37.5 mg via ORAL
  Filled 2022-06-21 (×4): qty 1
  Filled 2022-06-21: qty 10

## 2022-06-21 NOTE — Group Note (Signed)
[facility] LCSW Group Therapy Note  Date/Time: 06/21/2022  Type of Therapy and Topic:  Group Therapy:  Communication  Participation Level:  Did not attend  Description of Group:    In this group patients will be encouraged to explore how individuals communicate with one another appropriately and inappropriately. Patients will be guided to discuss their thoughts, feelings, and behaviors related to barriers communicating feelings, needs, and stressors. The group will process together ways to execute positive and appropriate communications, with attention given to how one use behavior, tone, and body language to communicate. Patient will be encouraged to reflect on an incident where they were successfully able to communicate and the factors that they believe helped them to communicate. Each patient will be encouraged to identify specific changes they are motivated to make in order to overcome communication barriers with self, peers, authority, and parents. This group will be process-oriented, with patients participating in exploration of their own experiences as well as giving and receiving support and challenging self as well as other group members.  Therapeutic Goals: Patient will identify how people communicate (body language, facial expression, and electronics) Also discuss tone, voice and how these impact what is communicated and how the message is perceived.  Patient will identify feelings (such as fear or worry), thought process and behaviors related to why people internalize feelings rather than express self openly. Patient will identify two changes they are willing to make to overcome communication barriers. Members will then practice through Role Play how to communicate by utilizing psycho-education material (such as I Feel statements and acknowledging feelings rather than displacing on others)   Summary of Patient Progress: x     Therapeutic Modalities:   Cognitive Behavioral  Therapy Solution Focused Therapy Motivational Interviewing Family Systems Approach   Abella Shugart, LCSW, LCAS Clincal Social Worker  [employer] [facility] [employer] [facility]

## 2022-06-21 NOTE — Group Note (Signed)
Recreation Therapy Group Note   Group Topic:Goal Setting  Group Date: 06/21/2022 Start Time: 1000 End Time: 1025 Facilitators: Rasool Rommel-McCall, LRT,CTRS Location: [address] Dayroom   Goal Area(s) Addresses:  Patient will identify the importance of setting life goals. Patient will identify how to improve in specific areas. Patient will express the importance of setting goals post d/c.  Group Description: Setting Life Goals.  Patients were given a worksheet that focused on patients setting goals in 6 different categories (family, friends, work/school, spirituality, body and mental health).  Patients were to identify what needed to be improved and set a goal of how to accomplish that goal.  LRT encouraged patients to be as specific as possible when coming up with their goals.  Patients would then share their top 4 categories they want to work on and share with group.     Affect/Mood: N/A   Participation Level: Did not attend    Clinical Observations/Individualized Feedback:      Plan: Continue to engage patient in RT group sessions 2-3x/week.   Nathan Mayo, LRT,CTRS 06/21/2022 12:50 PM

## 2022-06-21 NOTE — Progress Notes (Signed)
[facility] MD Progress Note  06/21/2022 1:18 PM Nathan Mayo  MRN:  [id number] Principal Problem: Undifferentiated schizophrenia [facility]) Diagnosis: Principal Problem:   Undifferentiated schizophrenia ([place]) Active Problems:   Insomnia   Delta-9-tetrahydrocannabinol (THC) dependence ([place])   Anxiety state  Reason for admission: Nathan Mayo is a 31 yo Asian male with no prior mental health history who was transported by [facility] to the [facility] on 06/13/22 after his parents involuntarily committed him for aggressive behaviors towards them and property damage at their home. Pt was transferred to this [facility] for treatment and stabilization of his mental status.   24 hr chart review: V/Ss with BP WNL and HR slightly elevated at 106 earlier today morning. Pt has continued to be compliant with PO medications for the past 24 hrs, did not require PRN medications or agitation protocol or forced medications in the past 24 hrs.   Patient assessment note (06/21/2022): Today, has continued to exhibit poor insight & poor judgment into his current mental status. He seems not be able to process knowledge or analyze and understand information that is provided to him. Pt has been told since day 1 of admission that his parents have stated that they do not want him to come back into their home. However, he continues to ruminate during each encounter about wanting to be discharged so that "he can go home". He is argumentative during encounter, becomes progressively angry as assessment continued.  Nathan Mayo denies SI, denies HI, denies AVH, denies feelings of paranoia, does not seem to be responding to any internal stimuli. Today morning was the first time that he has gotten out his room and sat in the day room. He is continuing not to tend to personal hygiene and grooming needs, has refused to take a shower since presentation to this hospital. Mood is irritable & angry. Affect is congruent. Pt grimaces and  blinks when talking, which is concerning for a tic disorder. Continuous hospitalization remains necessary to treat and stabilize mood prior to discharge. We are adding Effexor decreasing Effexor back to 37.5 mg daily and adding Depakote ER 500 mg BID for mood stabilization. We are continuing other medications as listed below. No TD/EPS type symptoms found on assessment, and pt denies any feelings of stiffness. AIMS: 0.   Total Time spent with patient: 45 minutes  Past Psychiatric History: none prior  Past Medical History: History reviewed. No pertinent past medical history. History reviewed. No pertinent surgical history. Family History: History reviewed. No pertinent family history. Family Psychiatric  History: none reported Social History:  Social History   Substance and Sexual Activity  Alcohol Use No     Social History   Substance and Sexual Activity  Drug Use No    Social History   Socioeconomic History   Marital status: Single    Spouse name: Not on file   Number of children: Not on file   Years of education: Not on file   Highest education level: Not on file  Occupational History   Not on file  Tobacco Use   Smoking status: Never   Smokeless tobacco: Never  Vaping Use   Vaping Use: Every day  Substance and Sexual Activity   Alcohol use: No   Drug use: No   Sexual activity: Never  Other Topics Concern   Not on file  Social History Narrative   Not on file   Social Determinants of Health   Financial Resource Strain: Not on file  Food Insecurity: No Food Insecurity (06/15/2022)   Hunger Vital Sign    Worried About Running Out of Food in the Last Year: Never true    Ran Out of Food in the Last Year: Never true  Transportation Needs: No Transportation Needs (06/15/2022)   PRAPARE - Administrator, Civil Service (Medical): No    Lack of Transportation (Non-Medical): No  Physical Activity: Not on file  Stress: Not on file  Social Connections: Not on  file   Additional Social History:   Sleep: Good  Appetite:  Good  Current Medications: Current Facility-Administered Medications  Medication Dose Route Frequency Provider Last Rate Last Admin   acetaminophen (TYLENOL) tablet 650 mg  650 mg Oral Q6H PRN Park Pope, MD       alum & mag hydroxide-simeth (MAALOX/MYLANTA) 200-200-20 MG/5ML suspension 30 mL  30 mL Oral Q4H PRN Park Pope, MD       benztropine (COGENTIN) tablet 1 mg  1 mg Oral BID Roselle Locus, MD   1 mg at 06/21/22 5009   Or   benztropine mesylate (COGENTIN) injection 1 mg  1 mg Intramuscular BID Hill, Shelbie Hutching, MD       haloperidol lactate (HALDOL) injection 5 mg  5 mg Intramuscular Q6H PRN Park Pope, MD       And   benztropine mesylate (COGENTIN) injection 1 mg  1 mg Intramuscular Q6H PRN Park Pope, MD       And   LORazepam (ATIVAN) injection 1 mg  1 mg Intramuscular Q6H PRN Park Pope, MD       divalproex (DEPAKOTE ER) 24 hr tablet 500 mg  500 mg Oral Daily Starleen Blue, NP   500 mg at 06/21/22 1246   haloperidol (HALDOL) tablet 5 mg  5 mg Oral q AM Starleen Blue, NP   5 mg at 06/21/22 3818   Or   haloperidol lactate (HALDOL) injection 5 mg  5 mg Intramuscular q AM Ikia Cincotta, NP       haloperidol (HALDOL) tablet 7.5 mg  7.5 mg Oral QPC supper Starleen Blue, NP   7.5 mg at 06/20/22 2993   Or   haloperidol lactate (HALDOL) injection 7.5 mg  7.5 mg Intramuscular QPC supper Starleen Blue, NP       hydrOXYzine (ATARAX) tablet 25 mg  25 mg Oral TID PRN Park Pope, MD       magnesium hydroxide (MILK OF MAGNESIA) suspension 30 mL  30 mL Oral Daily PRN Park Pope, MD       Melene Muller ON 06/22/2022] venlafaxine XR (EFFEXOR-XR) 24 hr capsule 37.5 mg  37.5 mg Oral Q breakfast Mandolin Falwell, NP        Lab Results:  No results found for this or any previous visit (from the past 48 hour(s)).   Blood Alcohol level:  Lab Results  Component Value Date   ETH <10 06/13/2022   ETH <10 11/28/2021     Metabolic Disorder Labs: Lab Results  Component Value Date   HGBA1C 5.2 06/13/2022   MPG 103 06/13/2022   No results found for: "PROLACTIN" Lab Results  Component Value Date   CHOL 156 06/16/2022   TRIG 52 06/16/2022   HDL 49 06/16/2022   CHOLHDL 3.2 06/16/2022   VLDL 10 06/16/2022   LDLCALC 97 06/16/2022   LDLCALC 81 06/13/2022    Physical Findings: AIMS: Facial and Oral Movements Muscles of Facial Expression: None, normal Lips and Perioral Area: None, normal Jaw: None, normal  Tongue: None, normal,Extremity Movements Upper (arms, wrists, hands, fingers): None, normal Lower (legs, knees, ankles, toes): None, normal, Trunk Movements Neck, shoulders, hips: None, normal, Overall Severity Severity of abnormal movements (highest score from questions above): None, normal Incapacitation due to abnormal movements: None, normal Patient's awareness of abnormal movements (rate only patient's report): No Awareness, Dental Status Current problems with teeth and/or dentures?: No Does patient usually wear dentures?: No  CIWA: 0  COWS: 0  AIMS:0 Musculoskeletal: Strength & Muscle Tone: within normal limits Gait & Station: normal Patient leans: N/A  Psychiatric Specialty Exam:  Presentation  General Appearance:  Disheveled  Eye Contact: Fair  Speech: Clear and Coherent  Speech Volume: Normal  Handedness: Right   Mood and Affect  Mood: Angry; Irritable  Affect: Congruent   Thought Process  Thought Processes: Coherent  Descriptions of Associations:Intact  Orientation:Full (Time, Mayo and Person)  Thought Content:Logical  History of Schizophrenia/Schizoaffective disorder:No  Duration of Psychotic Symptoms:Greater than six months  Hallucinations:Hallucinations: None  Ideas of Reference:None  Suicidal Thoughts:Suicidal Thoughts: No  Homicidal Thoughts:Homicidal Thoughts: No   Sensorium  Memory: Immediate  Good  Judgment: Poor  Insight: Poor   Executive Functions  Concentration: Poor  Attention Span: Poor  Recall: Poor  Fund of Knowledge: Poor  Language: Fair  Psychomotor Activity  Psychomotor Activity: Psychomotor Activity: Normal  Assets  Assets: Resilience  Sleep  Sleep: Sleep: Good  Physical Exam: Physical Exam Constitutional:      Appearance: Normal appearance.  HENT:     Head: Normocephalic.     Nose: Nose normal.  Eyes:     Pupils: Pupils are equal, round, and reactive to light.  Pulmonary:     Effort: Pulmonary effort is normal.  Musculoskeletal:        General: Normal range of motion.     Cervical back: Normal range of motion.  Neurological:     Mental Status: He is alert and oriented to person, Mayo, and time.    Review of Systems  Constitutional:  Negative for fever.  HENT:  Negative for sore throat.   Eyes: Negative.  Negative for blurred vision.  Respiratory: Negative.    Cardiovascular: Negative.  Negative for chest pain.  Gastrointestinal:  Negative for heartburn.  Skin: Negative.   Neurological:  Negative for dizziness.  Psychiatric/Behavioral:  Positive for depression and substance abuse. Negative for hallucinations, memory loss and suicidal ideas. The patient is nervous/anxious. The patient does not have insomnia.    Blood pressure 98/68, pulse (!) 106, temperature (!) 97.4 F (36.3 C), temperature source Oral, resp. rate 16, height 5\' 11"  (1.803 m), weight 62.6 kg, SpO2 100 %. Body mass index is 19.25 kg/m.  Treatment Plan Summary: Daily contact with patient to assess and evaluate symptoms and progress in treatment and Medication management   Observation Level/Precautions:  15 minute checks  Laboratory:  Labs reviewed   Psychotherapy:  Unit Group sessions  Medications:  See [facility]  Consultations:  To be determined   Discharge Concerns:  Safety, medication compliance, mood stability  Estimated LOS: 5-7 days  Other:  N/A     Labs: Labs independently reviewed on 06/17/2022: TSH WNL, Lipid panel WNL, HA1C WNL, CMP WNL, CBC WNL. Baseline EKG with Qtc of 440.   PLAN Safety and Monitoring: Voluntary admission to inpatient psychiatric unit for safety, stabilization and treatment Daily contact with patient to assess and evaluate symptoms and progress in treatment Patient's case to be discussed in multi-disciplinary team meeting Observation Level : q15 minute  checks Vital signs: q12 hours Precautions: Safety   Long Term Goal(s): Improvement in symptoms so as ready for discharge   Short Term Goals: Ability to identify changes in lifestyle to reduce recurrence of condition will improve, Ability to disclose and discuss suicidal ideas, Ability to demonstrate self-control will improve, Ability to identify and develop effective coping behaviors will improve, Compliance with prescribed medications will improve, and Ability to identify triggers associated with substance abuse/mental health issues will improve   Daiagnoses:  Principal Problem:   Undifferentiated schizophrenia ([facility]) Active Problems:   Insomnia   Delta-9-tetrahydrocannabinol (THC) dependence ([facility])   Anxiety state   Medications -Continue Forced Meds today-Continue Haldol 5 mg IM/5 mg PO in the mornings and Haldol 7.5 mg PO/Haldol 7.5 mg IM Q HS (Give IM if pt refuses PO) -For mood stabilization -Decrease Effexor to 37.5 mg  -Start Depakote ER 500 mg BID for mood stabilization (anger & aggressive behaviors management) -Previously Discontinued Risperdal M-tabs 1 mg in the mornings and 2 mg  - Previously Discontinued Trazodone 50 mg-Grogginess & hypotension -Continue Haldol 5 mg/Cogentin 1mg /Ativan 1 mg PRN Q 6 H for agitation -Continue Hydroxyzine 25 mg every 6 hours PRN   Other PRNS -Continue Tylenol 650 mg every 6 hours PRN for mild pain -Continue Maalox 30 mg every 4 hrs PRN for indigestion -Continue Milk of Magnesia as needed every 6 hrs for  constipation   Discharge Planning: Social work and case management to assist with discharge planning and identification of hospital follow-up needs prior to discharge Estimated LOS: 5-7 days Discharge Concerns: Need to establish a safety plan; Medication compliance and effectiveness Discharge Goals: Return home with outpatient referrals for mental health follow-up including medication management/psychotherapy   I certify that inpatient services furnished can reasonably be expected to improve the patient's condition.     Nicholes Rough, NP 06/21/2022, 1:18 PMPatient ID: Nathan Mayo, male   DOB: 1991-07-08, 31 y.o.   MRN: [id number] Patient ID: Nathan Mayo, male   DOB: 05/18/1991, 31 y.o.   Patient ID: Nathan Mayo, male   DOB: 09/03/1990, 31 y.o.   MRN: [id number]

## 2022-06-21 NOTE — Plan of Care (Signed)
  Problem: Coping: Goal: Ability to verbalize frustrations and anger appropriately will improve Outcome: Progressing Goal: Ability to demonstrate self-control will improve Outcome: Progressing   Problem: Health Behavior/Discharge Planning: Goal: Compliance with treatment plan for underlying cause of condition will improve Outcome: Progressing   Problem: Education: Goal: Knowledge of the prescribed therapeutic regimen will improve Outcome: Progressing   Problem: Coping: Goal: Will verbalize feelings Outcome: Progressing

## 2022-06-21 NOTE — BH IP Treatment Plan (Signed)
Interdisciplinary Treatment and Diagnostic Plan Update  06/21/2022 Time of Session: 8:30am, update Mayfield Schoene MRN: [id number]  Principal Diagnosis: Undifferentiated schizophrenia [facility])  Secondary Diagnoses: Principal Problem:   Undifferentiated schizophrenia (HCC) Active Problems:   Insomnia   Delta-9-tetrahydrocannabinol (THC) dependence (HCC)   Anxiety state   Current Medications:  Current Facility-Administered Medications  Medication Dose Route Frequency Provider Last Rate Last Admin   acetaminophen (TYLENOL) tablet 650 mg  650 mg Oral Q6H PRN Park Pope, MD       alum & mag hydroxide-simeth (MAALOX/MYLANTA) 200-200-20 MG/5ML suspension 30 mL  30 mL Oral Q4H PRN Park Pope, MD       benztropine (COGENTIN) tablet 1 mg  1 mg Oral BID Roselle Locus, MD   1 mg at 06/21/22 2423   Or   benztropine mesylate (COGENTIN) injection 1 mg  1 mg Intramuscular BID Roselle Locus, MD       haloperidol lactate (HALDOL) injection 5 mg  5 mg Intramuscular Q6H PRN Park Pope, MD       And   benztropine mesylate (COGENTIN) injection 1 mg  1 mg Intramuscular Q6H PRN Park Pope, MD       And   LORazepam (ATIVAN) injection 1 mg  1 mg Intramuscular Q6H PRN Park Pope, MD       haloperidol (HALDOL) tablet 5 mg  5 mg Oral q AM Starleen Blue, NP   5 mg at 06/21/22 5361   Or   haloperidol lactate (HALDOL) injection 5 mg  5 mg Intramuscular q AM Nkwenti, Doris, NP       haloperidol (HALDOL) tablet 7.5 mg  7.5 mg Oral QPC supper Starleen Blue, NP   7.5 mg at 06/20/22 4431   Or   haloperidol lactate (HALDOL) injection 7.5 mg  7.5 mg Intramuscular QPC supper Starleen Blue, NP       hydrOXYzine (ATARAX) tablet 25 mg  25 mg Oral TID PRN Park Pope, MD       magnesium hydroxide (MILK OF MAGNESIA) suspension 30 mL  30 mL Oral Daily PRN Park Pope, MD       Melene Muller ON 06/22/2022] venlafaxine XR (EFFEXOR-XR) 24 hr capsule 75 mg  75 mg Oral Q breakfast Nkwenti, Doris, NP       PTA  Medications: Medications Prior to Admission  Medication Sig Dispense Refill Last Dose   risperiDONE (RISPERDAL M-TABS) 1 MG disintegrating tablet Take 1 tablet (1 mg total) by mouth 2 (two) times daily.  0     Patient Stressors: Financial difficulties   Health problems   Marital or family conflict   Medication change or noncompliance    Patient Strengths: Manufacturing systems engineer  Supportive family/friends   Treatment Modalities: Medication Management, Group therapy, Case management,  1 to 1 session with clinician, Psychoeducation, Recreational therapy.   Physician Treatment Plan for Primary Diagnosis: Undifferentiated schizophrenia (HCC) Long Term Goal(s): Improvement in symptoms so as ready for discharge   Short Term Goals: Ability to identify changes in lifestyle to reduce recurrence of condition will improve Ability to disclose and discuss suicidal ideas Ability to demonstrate self-control will improve Ability to identify and develop effective coping behaviors will improve Compliance with prescribed medications will improve Ability to identify triggers associated with substance abuse/mental health issues will improve  Medication Management: Evaluate patient's response, side effects, and tolerance of medication regimen.  Therapeutic Interventions: 1 to 1 sessions, Unit Group sessions and Medication administration.  Evaluation of Outcomes: Not Progressing  Physician Treatment Plan for  Secondary Diagnosis: Principal Problem:   Undifferentiated schizophrenia (HCC) Active Problems:   Insomnia   Delta-9-tetrahydrocannabinol (THC) dependence (HCC)   Anxiety state  Long Term Goal(s): Improvement in symptoms so as ready for discharge   Short Term Goals: Ability to identify changes in lifestyle to reduce recurrence of condition will improve Ability to disclose and discuss suicidal ideas Ability to demonstrate self-control will improve Ability to identify and develop effective  coping behaviors will improve Compliance with prescribed medications will improve Ability to identify triggers associated with substance abuse/mental health issues will improve     Medication Management: Evaluate patient's response, side effects, and tolerance of medication regimen.  Therapeutic Interventions: 1 to 1 sessions, Unit Group sessions and Medication administration.  Evaluation of Outcomes: Not Progressing   RN Treatment Plan for Primary Diagnosis: Undifferentiated schizophrenia (HCC) Long Term Goal(s): Knowledge of disease and therapeutic regimen to maintain health will improve  Short Term Goals: Ability to remain free from injury will improve, Ability to verbalize frustration and anger appropriately will improve, Ability to demonstrate self-control, Ability to participate in decision making will improve, Ability to verbalize feelings will improve, Ability to disclose and discuss suicidal ideas, Ability to identify and develop effective coping behaviors will improve, and Compliance with prescribed medications will improve  Medication Management: RN will administer medications as ordered by provider, will assess and evaluate patient's response and provide education to patient for prescribed medication. RN will report any adverse and/or side effects to prescribing provider.  Therapeutic Interventions: 1 on 1 counseling sessions, Psychoeducation, Medication administration, Evaluate responses to treatment, Monitor vital signs and CBGs as ordered, Perform/monitor CIWA, COWS, AIMS and Fall Risk screenings as ordered, Perform wound care treatments as ordered.  Evaluation of Outcomes: Not Progressing   LCSW Treatment Plan for Primary Diagnosis: Undifferentiated schizophrenia (HCC) Long Term Goal(s): Safe transition to appropriate next level of care at discharge, Engage patient in therapeutic group addressing interpersonal concerns.  Short Term Goals: Engage patient in aftercare planning  with referrals and resources, Increase social support, Increase ability to appropriately verbalize feelings, Increase emotional regulation, Facilitate acceptance of mental health diagnosis and concerns, Facilitate patient progression through stages of change regarding substance use diagnoses and concerns, Identify triggers associated with mental health/substance abuse issues, and Increase skills for wellness and recovery  Therapeutic Interventions: Assess for all discharge needs, 1 to 1 time with Social worker, Explore available resources and support systems, Assess for adequacy in community support network, Educate family and significant other(s) on suicide prevention, Complete Psychosocial Assessment, Interpersonal group therapy.  Evaluation of Outcomes: Not Progressing   Progress in Treatment: Attending groups: No. Participating in groups: No. Taking medication as prescribed: Yes. Toleration medication: Yes. Family/Significant other contact made: Yes, individual(s) contacted:  Jesusita Oka (Dad) [phone] Patient understands diagnosis: No. Discussing patient identified problems/goals with staff: No. Medical problems stabilized or resolved: Yes. Denies suicidal/homicidal ideation: Yes. Issues/concerns per patient self-inventory: No.   New problem(s) identified: No, Describe:  none reported   New Short Term/Long Term Goal(s):   medication stabilization, elimination of SI thoughts, development of comprehensive mental wellness plan.    Patient Goals:  Pt continues to work on goals stated at initial treatment team.  Care team will continue to provide interventions and support to meet goals.   Discharge Plan or Barriers: Patient is homeless. Patient is resistant to treatment. Patient will be connected with therapy and med management.   Reason for Continuation of Hospitalization: Aggression Anxiety Hallucinations Medication stabilization Other; describe psychosis  Estimated Length  of Stay:  4 weeks    Last 3 [place] Suicide Severity Risk Score: Flowsheet Row [facility] (Current) [facility]20[facility] in [facility] from [facility]/28/2021 in [facility]  C-SSRS RISK CATEGORY No Risk No Risk No Risk       Last PHQ 2/9 Scores:    11/29/2021    1:11 AM  Depression screen PHQ 2/9  Decreased Interest 1  Down, Depressed, Hopeless 0  PHQ - 2 Score 1  Altered sleeping 0  Tired, decreased energy 0  Change in appetite 0  Feeling bad or failure about yourself  0  Trouble concentrating 1  Moving slowly or fidgety/restless 0  Suicidal thoughts 0  PHQ-9 Score 2  Difficult doing work/chores Not difficult at all    Scribe for Treatment Team: Beatris Si, LCSW 06/21/2022 10:07 AM

## 2022-06-21 NOTE — Progress Notes (Signed)
D- Patient alert and oriented. Denies SI, HI, AVH, and pain. Patient denies anxiety and depression but was guarded and minimal with his interaction. Patient attended evening wrap up group.  A-Scheduled medications administered to patient, per MAR. Support and encouragement provided.  Routine safety checks conducted every 15 minutes.  Patient informed to notify staff with problems or concerns.  R- No adverse drug reactions noted. Patient contracts for safety at this time. Patient compliant with medications and treatment plan. Patient receptive, calm, and cooperative. Patient interacts well with others on the unit.  Patient remains safe at this time.

## 2022-06-21 NOTE — [facility] Group Notes (Signed)
[facility] Group Notes:  (Nursing/MHT/Case Management/Adjunct)  Date:  06/21/2022  Time:  9:40 AM  Type of Therapy:  Psychoeducational Skills  Participation Level:  Did Not Attend    Nathan Mayo 06/21/2022, 9:40 AM

## 2022-06-21 NOTE — Progress Notes (Signed)
Adult Psychoeducational Group Note  Date:  06/21/2022 Time:  9:06 PM  Group Topic/Focus:  Wrap-Up Group:   The focus of this group is to help patients review their daily goal of treatment and discuss progress on daily workbooks.  Participation Level:  Active  Participation Quality:  Appropriate  Affect:  Appropriate  Cognitive:  Appropriate  Insight: Appropriate  Engagement in Group:  Engaged  Modes of Intervention:  Discussion  Additional Comments:   Pt states that he had a good day and was able to get some rest and eat today. Pt denies everything.   Vevelyn Pat 06/21/2022, 9:06 PM

## 2022-06-21 NOTE — Progress Notes (Signed)
   06/21/22 1000  Psych Admission Type (Psych Patients Only)  Admission Status Involuntary  Psychosocial Assessment  Patient Complaints None  Eye Contact Fair  Facial Expression Wide-eyed;Anxious  Affect Appropriate to circumstance  Speech Pressured  Interaction Assertive  Motor Activity Slow  Appearance/Hygiene In scrubs  Behavior Characteristics Appropriate to situation  Mood Preoccupied  Thought Process  Coherency Circumstantial  Content WDL  Delusions None reported or observed  Perception WDL  Hallucination None reported or observed  Judgment Impaired  Confusion None  Danger to Self  Current suicidal ideation? Denies  Danger to Others  Danger to Others None reported or observed

## 2022-06-22 LAB — RESP PANEL BY RT-PCR (RSV, FLU A&B, COVID)  RVPGX2
Influenza A by PCR: NEGATIVE
Influenza B by PCR: NEGATIVE
Resp Syncytial Virus by PCR: NEGATIVE
SARS Coronavirus 2 by RT PCR: NEGATIVE

## 2022-06-22 NOTE — Progress Notes (Signed)
   06/22/22 0518  Sleep  Number of Hours 4.25
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
## 2022-06-22 NOTE — Progress Notes (Signed)
   06/22/22 0900  Psych Admission Type (Psych Patients Only)  Admission Status Involuntary  Psychosocial Assessment  Patient Complaints None  Eye Contact Glaring  Facial Expression Anxious  Affect Apprehensive  Speech Logical/coherent  Interaction Minimal  Motor Activity Slow  Appearance/Hygiene In scrubs  Behavior Characteristics Appropriate to situation  Mood Anxious  Thought Process  Coherency Circumstantial  Content WDL  Delusions None reported or observed  Perception WDL  Hallucination None reported or observed  Judgment Impaired  Confusion None  Danger to Self  Current suicidal ideation? Denies  Danger to Others  Danger to Others None reported or observed

## 2022-06-22 NOTE — Progress Notes (Signed)
MHT reported to this RN that the patient was awake and pacing his room. Patient was approached by this RN and asked if he was ok and needed to talk or needed any medication for anxiety. Patient began asking questions about discharge and length of stay. Patient was advised that LOS was different for each patient and determined by patient safety and participation in treatment plan. Patient educated on the providers determining discharge date based on above criteria and others. Patient requested to speak with a provider tomorrow in regards to discharge planning. Patient verbalized understanding of education. Patient remains calm. Staff will continue to monitor.

## 2022-06-22 NOTE — Group Note (Signed)
Recreation Therapy Group Note   Group Topic:Other  Group Date: 06/22/2022 Start Time: 1005 End Time: 1030 Facilitators: Marjettte Aisley Whan-McCall, LRT,CTRS Location: [address] Dayroom   Goal Area(s) Addresses:  Patient will participate in the creative process to complete all crafts.  Patient will interact pro-socially with staff and peers. Patient will share traditions, activities, and positive feelings produced during the holidays.    Group Description: LRT facilitated a therapeutic art activity to encourage self-expression and creativity in recognition of the approaching holidays. Writer explained patients could make snowmen, wreaths or missle that  will be used to decorate the unit to boost mood and create a positive atmosphere. Patients were encouraged to engage in leisure discussions about festive activities and hobbies they like to participate in during the winter season.   Affect/Mood: N/A   Participation Level: Did not attend    Clinical Observations/Individualized Feedback:     Plan: Continue to engage patient in RT group sessions 2-3x/week.   Janina Trafton-McCall, LRT,CTRS 06/22/2022 10:57 AM

## 2022-06-22 NOTE — Progress Notes (Signed)
   06/22/22 2036  Psych Admission Type (Psych Patients Only)  Admission Status Involuntary  Psychosocial Assessment  Patient Complaints None  Eye Contact Glaring  Facial Expression Flat  Affect Flat  Speech Logical/coherent  Interaction Minimal  Motor Activity Slow  Appearance/Hygiene In scrubs  Behavior Characteristics Cooperative;Calm  Mood Ambivalent  Thought Process  Coherency Circumstantial  Content WDL  Delusions None reported or observed  Perception WDL  Hallucination None reported or observed  Judgment Impaired  Confusion None  Danger to Self  Current suicidal ideation? Denies  Agreement Not to Harm Self Yes  Description of Agreement Verbal  Danger to Others  Danger to Others None reported or observed

## 2022-06-22 NOTE — Progress Notes (Signed)
   06/21/22 2010  Psych Admission Type (Psych Patients Only)  Admission Status Involuntary  Psychosocial Assessment  Patient Complaints None  Eye Contact Glaring  Facial Expression Flat;Anxious  Affect Apprehensive  Speech Logical/coherent;Slow  Interaction Minimal  Motor Activity Slow  Appearance/Hygiene In scrubs  Behavior Characteristics Cooperative;Anxious  Mood Anxious  Thought Process  Coherency Circumstantial  Content WDL  Delusions None reported or observed  Perception WDL  Hallucination None reported or observed  Judgment Impaired  Confusion None  Danger to Self  Current suicidal ideation? Denies  Agreement Not to Harm Self Yes  Description of Agreement Verbal  Danger to Others  Danger to Others None reported or observed   D- Patient alert and oriented. Denies SI, HI, AVH, and pain. Patient denies anxiety and depression but was guarded and minimal with his interaction. Patient attended evening wrap up group.   A-Scheduled medications administered to patient, per MAR. Support and encouragement provided.  Routine safety checks conducted every 15 minutes.  Patient informed to notify staff with problems or concerns.   R- No adverse drug reactions noted. Patient contracts for safety at this time. Patient compliant with medications and treatment plan. Patient receptive, calm, and cooperative. Patient interacts well with others on the unit.  Patient remains safe at this time.

## 2022-06-22 NOTE — Plan of Care (Signed)
  Problem: Education: Goal: Emotional status will improve Outcome: Progressing   Problem: Activity: Goal: Sleeping patterns will improve Outcome: Progressing   Problem: Coping: Goal: Ability to verbalize frustrations and anger appropriately will improve Outcome: Progressing   Problem: Health Behavior/Discharge Planning: Goal: Compliance with treatment plan for underlying cause of condition will improve Outcome: Progressing   Problem: Health Behavior/Discharge Planning: Goal: Compliance with prescribed medication regimen will improve Outcome: Progressing

## 2022-06-23 DIAGNOSIS — F39 Unspecified mood [affective] disorder: Principal | ICD-10-CM | POA: Diagnosis present

## 2022-06-23 NOTE — Plan of Care (Signed)
  Problem: Education: Goal: Emotional status will improve Outcome: Progressing Goal: Mental status will improve Outcome: Progressing   Problem: Health Behavior/Discharge Planning: Goal: Compliance with treatment plan for underlying cause of condition will improve Outcome: Progressing   Problem: Education: Goal: Will be free of psychotic symptoms Outcome: Progressing   Problem: Coping: Goal: Coping ability will improve Outcome: Progressing   Problem: Role Relationship: Goal: Ability to interact with others will improve Outcome: Progressing   Problem: Safety: Goal: Ability to remain free from injury will improve Outcome: Progressing   Problem: Self-Concept: Goal: Will verbalize positive feelings about self Outcome: Progressing   Problem: Coping: Goal: Ability to identify and develop effective coping behavior will improve Outcome: Progressing   Problem: Safety: Goal: Ability to disclose and discuss suicidal ideas will improve Outcome: Progressing

## 2022-06-23 NOTE — Group Note (Signed)
[facility] LCSW Group Therapy Note   Group Date: 06/23/2022 Start Time: 1300 End Time: 1400  Type of Therapy and Topic:  Group Therapy:  Self-Care Wheel  Participation Level:  Minimal   Description of Group This process group involved patients discussing the importance of self-care in different areas of life (professional, personal, emotional, psychological, spiritual, and physical) in order to achieve healthy life balance.  The group talked about what self-care in each of those areas would constitute and then specifically listed how they want to provide themselves with improved self-care.  Therapeutic Goals Patient will learn how to break self-care down into various areas of life Patient will participate in generating ideas about healthy self-care options in each category Patients will be supportive of one another and receive support from others Patient will identify one healthy self-care activity to add to his/her life   Summary of Patient Progress:  Patient came to group and accepted all provided materials. Patient sat his worksheets down beside him in the chair. Patient did give a few insightful self-care examples. Patient did not engage anymore into group. Patient remain in group the entire time.  Therapeutic Modalities Processing Psychoeducational    Isabella Bowens, LCSWA

## 2022-06-23 NOTE — Group Note (Signed)
Recreation Therapy Group Note   Group Topic:Problem Solving  Group Date: 06/23/2022 Start Time: 1015 End Time: 1053 Facilitators: Bruna Dills-McCall, LRT,CTRS Location: [address] Dayroom   Goal Area(s) Addresses:  Patient will verbalize importance of using appropriate problem solving techniques.  Patient will identify positive change associated with effective problem solving skills.   Group Description:  Code Breakers.  LRT and patients talked the importance of problem solving techniques.  LRT and patients also discussed how problem solving affects everyday decisions.  LRT then gave patients a worksheet on code breakers that helped them work on problem solving skills.  While patients worked, LRT played music for the patients.    Affect/Mood: N/A   Participation Level: Did not attend    Clinical Observations/Individualized Feedback:     Plan: Continue to engage patient in RT group sessions 2-3x/week.   Sadonna Kotara-McCall, LRT,CTRS 06/23/2022 1:58 PM

## 2022-06-23 NOTE — Progress Notes (Signed)
   06/23/22 0528  Sleep  Number of Hours 4.5

## 2022-06-23 NOTE — Progress Notes (Signed)
   06/23/22 2049  Psych Admission Type (Psych Patients Only)  Admission Status Involuntary  Psychosocial Assessment  Patient Complaints None  Eye Contact Glaring  Facial Expression Flat  Affect Flat  Speech Logical/coherent  Interaction Minimal  Motor Activity Slow  Appearance/Hygiene In scrubs  Behavior Characteristics Cooperative;Calm  Mood Ambivalent  Thought Process  Coherency Circumstantial  Content WDL  Delusions None reported or observed  Perception WDL  Hallucination None reported or observed  Judgment Impaired  Confusion None  Danger to Self  Current suicidal ideation? Denies  Agreement Not to Harm Self Yes  Description of Agreement Verbal  Danger to Others  Danger to Others None reported or observed

## 2022-06-23 NOTE — Progress Notes (Signed)
[facility] LCSW Progress Note   06/23/2022    12:17 PM  [name]      Type of Note: Homeless Packet   CSW provided patient with a homeless packet and highlighted the [facility] for him to call since he parents states that he cannot come back home to them. Patient accepted the packet and stated that he would call his dad so he can go home.     Signed:   Jacob Moores, MSW, [facility] 06/23/2022 12:17 PM

## 2022-06-23 NOTE — Progress Notes (Signed)
The focus of this group is to help patients review their daily goal of treatment and discuss progress on daily workbooks. Pt did not attend the evening group. 

## 2022-06-23 NOTE — [facility] Group Notes (Signed)
Adult Psychoeducational Group Note  Date:  06/23/2022 Time:  9:37 AM  Group Topic/Focus:  Goals Group:   The focus of this group is to help patients establish daily goals to achieve during treatment and discuss how the patient can incorporate goal setting into their daily lives to aide in recovery.  Participation Level:  Did Not Attend   Donell Beers 06/23/2022, 9:37 AM

## 2022-06-23 NOTE — Plan of Care (Signed)
  Problem: Activity: Goal: Sleeping patterns will improve Outcome: Progressing   Problem: Coping: Goal: Ability to demonstrate self-control will improve Outcome: Progressing   Problem: Health Behavior/Discharge Planning: Goal: Compliance with treatment plan for underlying cause of condition will improve Outcome: Progressing   Problem: Education: Goal: Will be free of psychotic symptoms Outcome: Progressing

## 2022-06-23 NOTE — Progress Notes (Signed)
[facility]  06/23/2022 3:13 PM Nathan Mayo  MRN:  [id number] Principal Problem: Unspecified mood (affective) disorder (HCC) Diagnosis: Principal Problem:   Unspecified mood (affective) disorder (HCC) Active Problems:   Insomnia   Delta-9-tetrahydrocannabinol (THC) dependence (HCC)   Anxiety state  Reason for admission: Nathan Mayo is a 31 yo Asian male with no prior mental health history who [place] [facility] [place] [facility] [place] [facility] [place]22 after his parents involuntarily committed him for aggressive behaviors towards them and property damage at their home. Pt [facility] for treatment and stabilization of his mental status.   24 hr chart review: V/Ss WNL for the past 24 hrs. Pt has continued to be compliant with PO medications for the past 24 hrs, did not require PRN medications or agitation protocol or forced medications in the past 24 hrs. Pt slept a total of 4.25 hrs last night as per nursing flowsheets.  Patient assessment note (06/23/2022): Pt has been isolative to his room for the past 24 hrs, only comes out for meals, has not been tending to personal hygiene, and is defiant when reinforcements are given for him to participate in activities. Pt is calm until something is being discussed which he is in disagreement, and he gets argumentative and progressively angry. Pt is not motivated to do anything on the unit, and uninterested in unit activities. He states that there is nothing wrong with him and that he is "fine". He repeatedly asks to be discharged, and denies SI, denies HI, denies AVH. There is no evidence if delusional thinking. He states that he is only taking medications because he knows that there is a forced medication order if he does not comply with PO meds, and state that he does not need any medications. Pt continues to exhibit poor insight and judgment into his mental status. Pt got angry when asked if he is remorseful  for the way he reacted after his parents refused to buy him the vape. He would not answer. Behaviors are suggestive of an antisocial personality d/o. It is highly unlikely that pt will continue taking his medications after discharge.   Pt is noncompliant with care on our unit, and his behaviors are not related to psychosis, but fit more with a personality d/o. There are no overt signs of psychosis. He is oriented to person, time , place, situation and able to list the most recent 3 Presidents of the [place] in chronological order when they served. We plan to discharge pt tomorrow after outpatient f/u appointments have been made for him. He remains disheveled, has not had a shower since admission, is argumentative, states he does not smell when provided with positive reinforcements to take a shower. Depakote level ordered for tomorrow morning. Writer to call father to notify of discharge tomorrow. We are continuing other medications as listed below. No TD/EPS type symptoms found on assessment, and pt denies any feelings of stiffness. AIMS: 0.   Total Time spent with patient: 45 minutes  Past Psychiatric History: none prior  Past Medical History: History reviewed. No pertinent past medical history. History reviewed. No pertinent surgical history. Family History: History reviewed. No pertinent family history. Family Psychiatric  History: none reported Social History:  Social History   Substance and Sexual Activity  Alcohol Use No     Social History   Substance and Sexual Activity  Drug Use No    Social History   Socioeconomic History  Marital status: Single    Spouse name: Not on file   Number of children: Not on file   Years of education: Not on file   Highest education level: Not on file  Occupational History   Not on file  Tobacco Use   Smoking status: Never   Smokeless tobacco: Never  Vaping Use   Vaping Use: Every day  Substance and Sexual Activity   Alcohol use: No   Drug use:  No   Sexual activity: Never  Other Topics Concern   Not on file  Social History Narrative   Not on file   Social Determinants of Health   Financial Resource Strain: Not on file  Food Insecurity: No Food Insecurity (06/15/2022)   Hunger Vital Sign    Worried About Running Out of Food in the Last Year: Never true    Ran Out of Food in the Last Year: Never true  Transportation Needs: No Transportation Needs (06/15/2022)   PRAPARE - Administrator, Civil Service (Medical): No    Lack of Transportation (Non-Medical): No  Physical Activity: Not on file  Stress: Not on file  Social Connections: Not on file   Additional Social History:   Sleep: Good  Appetite:  Good  Current Medications: Current Facility-Administered Medications  Medication Dose Route Frequency Provider Last Rate Last Admin   acetaminophen (TYLENOL) tablet 650 mg  650 mg Oral Q6H PRN Park Pope, MD       alum & mag hydroxide-simeth (MAALOX/MYLANTA) 200-200-20 MG/5ML suspension 30 mL  30 mL Oral Q4H PRN Park Pope, MD       benztropine (COGENTIN) tablet 1 mg  1 mg Oral BID Hill, Shelbie Hutching, MD   1 mg at 06/23/22 0800   Or   benztropine mesylate (COGENTIN) injection 1 mg  1 mg Intramuscular BID Hill, Shelbie Hutching, MD       haloperidol lactate (HALDOL) injection 5 mg  5 mg Intramuscular Q6H PRN Park Pope, MD       And   benztropine mesylate (COGENTIN) injection 1 mg  1 mg Intramuscular Q6H PRN Park Pope, MD       And   LORazepam (ATIVAN) injection 1 mg  1 mg Intramuscular Q6H PRN Park Pope, MD       divalproex (DEPAKOTE ER) 24 hr tablet 500 mg  500 mg Oral Daily Starleen Blue, NP   500 mg at 06/23/22 9604   haloperidol (HALDOL) tablet 5 mg  5 mg Oral q AM Starleen Blue, NP   5 mg at 06/23/22 5409   Or   haloperidol lactate (HALDOL) injection 5 mg  5 mg Intramuscular q AM Fallon Haecker, NP       haloperidol (HALDOL) tablet 7.5 mg  7.5 mg Oral QPC supper Starleen Blue, NP   7.5 mg at 06/22/22  2036   Or   haloperidol lactate (HALDOL) injection 7.5 mg  7.5 mg Intramuscular QPC supper Starleen Blue, NP       hydrOXYzine (ATARAX) tablet 25 mg  25 mg Oral TID PRN Park Pope, MD       magnesium hydroxide (MILK OF MAGNESIA) suspension 30 mL  30 mL Oral Daily PRN Park Pope, MD       venlafaxine XR (EFFEXOR-XR) 24 hr capsule 37.5 mg  37.5 mg Oral Q breakfast Starleen Blue, NP   37.5 mg at 06/23/22 8119    Lab Results:  Results for orders placed or performed during the hospital encounter of 06/15/22 (from  the past 48 hour(s))  Resp panel by RT-PCR (RSV, Flu A&B, Covid) Anterior Nasal Swab     Status: None   Collection Time: 06/22/22  3:01 PM   Specimen: Anterior Nasal Swab  Result Value Ref Range   SARS Coronavirus 2 by RT PCR NEGATIVE NEGATIVE    Comment: (NOTE) SARS-CoV-2 target nucleic acids are NOT DETECTED.  The SARS-CoV-2 RNA is generally detectable in upper respiratory specimens during the acute phase of infection. The lowest concentration of SARS-CoV-2 viral copies this assay can detect is 138 copies/mL. A negative result does not preclude SARS-Cov-2 infection and should not be used as the sole basis for treatment or other patient management decisions. A negative result may occur with  improper specimen collection/handling, submission of specimen other than nasopharyngeal swab, presence of viral mutation(s) within the areas targeted by this assay, and inadequate number of viral copies(<138 copies/mL). A negative result must be combined with clinical observations, patient history, and epidemiological information. The expected result is Negative.  Fact Sheet for Patients:  BloggerCourse.com  Fact Sheet for Healthcare Providers:  SeriousBroker.it  This test is no t yet approved or cleared by the [place] FDA and  has been authorized for detection and/or diagnosis of SARS-CoV-2 by FDA under an Emergency Use  Authorization (EUA). This EUA will remain  in effect (meaning this test can be used) for the duration of the COVID-19 declaration under Section 564(b)(1) of the Act, 21 U.S.C.section 360bbb-3(b)(1), unless the authorization is terminated  or revoked sooner.       Influenza A by PCR NEGATIVE NEGATIVE   Influenza B by PCR NEGATIVE NEGATIVE    Comment: (NOTE) The Xpert Xpress SARS-CoV-2/FLU/RSV plus assay is intended as an aid in the diagnosis of influenza from Nasopharyngeal swab specimens and should not be used as a sole basis for treatment. Nasal washings and aspirates are unacceptable for Xpert Xpress SARS-CoV-2/FLU/RSV testing.  Fact Sheet for Patients: BloggerCourse.com  Fact Sheet for Healthcare Providers: SeriousBroker.it  This test is not yet approved or cleared by the [place] FDA and has been authorized for detection and/or diagnosis of SARS-CoV-2 by FDA under an Emergency Use Authorization (EUA). This EUA will remain in effect (meaning this test can be used) for the duration of the COVID-19 declaration under Section 564(b)(1) of the Act, 21 U.S.C. section 360bbb-3(b)(1), unless the authorization is terminated or revoked.     Resp Syncytial Virus by PCR NEGATIVE NEGATIVE    Comment: (NOTE) Fact Sheet for Patients: BloggerCourse.com  Fact Sheet for Healthcare Providers: SeriousBroker.it  This test is not yet approved or cleared by the [place] FDA and has been authorized for detection and/or diagnosis of SARS-CoV-2 by FDA under an Emergency Use Authorization (EUA). This EUA will remain in effect (meaning this test can be used) for the duration of the COVID-19 declaration under Section 564(b)(1) of the Act, 21 U.S.C. section 360bbb-3(b)(1), unless the authorization is terminated or revoked.  Performed at [facility], [address], [place], [place] [address]      Blood Alcohol level:  Lab Results  Component Value Date   [facility] <10 06/13/2022   ETH <10 11/28/2021    Metabolic Disorder Labs: Lab Results  Component Value Date   HGBA1C 5.2 06/13/2022   MPG 103 06/13/2022   No results found for: "PROLACTIN" Lab Results  Component Value Date   CHOL 156 06/16/2022   TRIG 52 06/16/2022   HDL 49 06/16/2022   CHOLHDL 3.2 06/16/2022  VLDL 10 06/16/2022   LDLCALC 97 06/16/2022   LDLCALC 81 06/13/2022    Physical Findings: AIMS: Facial and Oral Movements Muscles of Facial Expression: None, normal Lips and Perioral Area: None, normal Jaw: None, normal Tongue: None, normal,Extremity Movements Upper (arms, wrists, hands, fingers): None, normal Lower (legs, knees, ankles, toes): None, normal, Trunk Movements Neck, shoulders, hips: None, normal, Overall Severity Severity of abnormal movements (highest score from questions above): None, normal Incapacitation due to abnormal movements: None, normal Patient's awareness of abnormal movements (rate only patient's report): No Awareness, Dental Status Current problems with teeth and/or dentures?: No Does patient usually wear dentures?: No  CIWA: 0  COWS: 0  AIMS:0 Musculoskeletal: Strength & Muscle Tone: within normal limits Gait & Station: normal Patient leans: N/A  Psychiatric Specialty Exam:  Presentation  General Appearance:  Disheveled; Appropriate for Environment  Eye Contact: Other (comment) (glares)  Speech: Clear and Coherent  Speech Volume: Normal  Handedness: Right   Mood and Affect  Mood: Angry; Irritable  Affect: Congruent   Thought Process  Thought Processes: Coherent  Descriptions of Associations:Intact  Orientation:Full (Time, Place and Person)  Thought Content:Logical  History of Schizophrenia/Schizoaffective disorder:No  Duration of Psychotic Symptoms:Greater than six months  Hallucinations:Hallucinations:  None  Ideas of Reference:None  Suicidal Thoughts:Suicidal Thoughts: No  Homicidal Thoughts:Homicidal Thoughts: No   Sensorium  Memory: Immediate Poor  Judgment: Poor  Insight: Poor   Executive Functions  Concentration: Poor  Attention Span: Poor  Recall: Fair  Fund of Knowledge: Fair  Language: Fair  Psychomotor Activity  Psychomotor Activity: Psychomotor Activity: Normal  Assets  Assets: Resilience  Sleep  Sleep: Sleep: Good  Physical Exam: Physical Exam Constitutional:      Appearance: Normal appearance.  HENT:     Head: Normocephalic.     Nose: Nose normal.  Eyes:     Pupils: Pupils are equal, round, and reactive to light.  Pulmonary:     Effort: Pulmonary effort is normal.  Musculoskeletal:        General: Normal range of motion.     Cervical back: Normal range of motion.  Neurological:     Mental Status: He is alert and oriented to person, place, and time.    Review of Systems  Constitutional:  Negative for fever.  HENT:  Negative for sore throat.   Eyes: Negative.  Negative for blurred vision.  Respiratory: Negative.    Cardiovascular: Negative.  Negative for chest pain.  Gastrointestinal:  Negative for heartburn.  Skin: Negative.   Neurological:  Negative for dizziness.  Psychiatric/Behavioral:  Positive for depression and substance abuse. Negative for hallucinations, memory loss and suicidal ideas. The patient is nervous/anxious. The patient does not have insomnia.    Blood pressure 104/75, pulse 84, temperature 97.8 F (36.6 C), temperature source Oral, resp. rate 16, height  (1.803 m), weight 62.6 kg, SpO2 99 %. Body mass index is 19.25 kg/m.  Treatment Plan Summary: Daily contact with patient to assess and evaluate symptoms and progress in treatment and Medication management   Observation Level/Precautions:  15 minute checks  Laboratory:  Labs reviewed   Psychotherapy:  Unit Group sessions  Medications:  See [facility]   Consultations:  To be determined   Discharge Concerns:  Safety, medication compliance, mood stability  Estimated LOS: 5-7 days  Other:  N/A    Labs: Labs independently reviewed on 06/17/2022: TSH WNL, Lipid panel WNL, HA1C WNL, CMP WNL, CBC WNL. Baseline EKG with Qtc of 440.   PLAN  Safety and Monitoring: Voluntary admission to inpatient psychiatric unit for safety, stabilization and treatment Daily contact with patient to assess and evaluate symptoms and progress in treatment Patient's case to be discussed in multi-disciplinary team meeting Observation Level : q15 minute checks Vital signs: q12 hours Precautions: Safety   Long Term Goal(s): Improvement in symptoms so as ready for discharge   Short Term Goals: Ability to identify changes in lifestyle to reduce recurrence of condition will improve, Ability to disclose and discuss suicidal ideas, Ability to demonstrate self-control will improve, Ability to identify and develop effective coping behaviors will improve, Compliance with prescribed medications will improve, and Ability to identify triggers associated with substance abuse/mental health issues will improve   Daiagnoses:  Principal Problem:   Undifferentiated schizophrenia (HCC) Active Problems:   Insomnia   Delta-9-tetrahydrocannabinol (THC) dependence (HCC)   Anxiety state   Medications -Continue Forced Meds today-Continue Haldol 5 mg IM/5 mg PO in the mornings and Haldol 7.5 mg PO/Haldol 7.5 mg IM Q HS (Give IM if pt refuses PO) -For mood stabilization -Continue Effexor to 37.5 mg daily for depressive symptoms -Continue Depakote ER 500 mg BID for mood stabilization (anger & aggressive behaviors management)-Valproic acid level ordered for 06/24/2022. -Previously Discontinued Risperdal M-tabs 1 mg in the mornings and 2 mg  - Previously Discontinued Trazodone 50 mg-Grogginess & hypotension -Continue Haldol 5 mg/Cogentin 1mg /Ativan 1 mg PRN Q 6 H for agitation -Continue  Hydroxyzine 25 mg every 6 hours PRN   Other PRNS -Continue Tylenol 650 mg every 6 hours PRN for mild pain -Continue Maalox 30 mg every 4 hrs PRN for indigestion -Continue Milk of Magnesia as needed every 6 hrs for constipation   Discharge Planning: Social work and case management to assist with discharge planning and identification of hospital follow-up needs prior to discharge Estimated LOS: 5-7 days Discharge Concerns: Need to establish a safety plan; Medication compliance and effectiveness Discharge Goals: Return home with outpatient referrals for mental health follow-up including medication management/psychotherapy   I certify that inpatient services furnished can reasonably be expected to improve the patient's condition.     , NP 06/23/2022, 3:13 PMPatient ID: 06/25/2022, male   DOB: 06-01-1991, 31 y.o.   MRN: [id number] Patient ID: Nathan Mayo, male   DOB: [birth year], 31 y.o.   Patient ID: 37, male

## 2022-06-23 NOTE — Progress Notes (Signed)
Patient observed to be isolative to his room most of this shift, but came out for his medication and  during meal time. Patient observed with flat affect, and appeared with no emotions. Patient denies SI,HI, AVH and pain. Patient tolerated his scheduled medications per [facility] without issues. Support and encouragement provided, Q 15 minutes checks continues, safety maintained.

## 2022-06-23 NOTE — Progress Notes (Signed)
Patient ID: Nathan Mayo, male   DOB: 08/26/1990, 31 y.o.   MRN: [id number] Writer called pt's father as per his request made to CSW. Writer discussed pt's current hospital course with father; Writer discussed medications, rationales, benefits and possible side effects. Writer also discussed behaviors which pt has been exhibiting so far on the unit; discussed that it became necessary on admission to seek forced med order due to refusals to take medications by mouth. Writer also discussed with father the pt's refusals to comply with care such as not attending group sessions, &  stating that groups are not beneficial for him, refusals to take showers, or to sit in the day room, along with the fact that behaviors lean more towards an antisocial personality d/o, and that there has been no overt sign of psychosis since hospitalization. Writer also explained to pt's father that plan is to discharge pt tomorrow (12/14). Pt's father stated that he does not want pt discharged to his home because he has been dealing with pt's anger and defiance and his refusals to get helped for 9 years. Writer verbalized understanding and stated that plan is to send pt to a shelter tomorrow morning, and he verbalized understanding, thanked Clinical research associate, and call ended.

## 2022-06-24 LAB — VALPROIC ACID LEVEL: Valproic Acid Lvl: 32 ug/mL — ABNORMAL LOW (ref 50.0–100.0)

## 2022-06-24 MED ORDER — VENLAFAXINE HCL ER 37.5 MG PO CP24: 37.5000 mg | ORAL_CAPSULE | Freq: Every day | ORAL | 0 refills | Status: DC

## 2022-06-24 MED ORDER — HALOPERIDOL 5 MG PO TABS: 7.5000 mg | ORAL_TABLET | Freq: Every day | ORAL | 0 refills | Status: DC

## 2022-06-24 MED ORDER — BENZTROPINE MESYLATE 1 MG PO TABS: 1.0000 mg | ORAL_TABLET | Freq: Two times a day (BID) | ORAL | 0 refills | Status: DC

## 2022-06-24 MED ORDER — HALOPERIDOL 5 MG PO TABS: 5.0000 mg | ORAL_TABLET | Freq: Every morning | ORAL | 0 refills | Status: DC

## 2022-06-24 NOTE — Plan of Care (Signed)
Patient did not attend recreation therapy group sessions.   Yanelli Zapanta-McCall, LRT,CTRS 

## 2022-06-24 NOTE — Progress Notes (Signed)
Recreation Therapy Notes  INPATIENT RECREATION TR PLAN  Patient Details Name: Nathan Mayo MRN: [id number] DOB: [birth year] Today's Date: 06/24/2022  Rec Therapy Plan Is patient appropriate for Therapeutic Recreation?: Yes Treatment times per week: about 3 days Estimated Length of Stay: 5-7 days TR Treatment/Interventions: Group participation (Comment)  Discharge Criteria Pt will be discharged from therapy if:: Discharged Treatment plan/goals/alternatives discussed and agreed upon by:: Patient/family  Discharge Summary Short term goals set: See patient care plan Short term goals met: Not met Reason goals not met: Pt did not attend groups. Therapeutic equipment acquired: N/Nathan Reason patient discharged from therapy: Discharge from hospital Pt/family agrees with progress & goals achieved: Yes Date patient discharged from therapy: 06/24/22   Nathan Mayo, LRT,CTRS Nathan Mayo Nathan Mayo 06/24/2022, 11:47 AM

## 2022-06-24 NOTE — Progress Notes (Signed)
Patient discharged. Discharge instructions reviewed with patient. Patient verbalized understanding. Patient received all personal belongings. Patient left unit at 1125.

## 2022-06-24 NOTE — Progress Notes (Signed)
Pt discharged to lobby. Pt was stable and appreciative at that time. All papers, samples and prescriptions were given and valuables returned. Verbal understanding expressed. Denies SI/HI and A/VH. Pt given opportunity to express concerns and ask questions.  

## 2022-06-24 NOTE — Progress Notes (Signed)
  [facility] Adult Case Management Discharge Plan :  Will you be returning to the same living situation after discharge:  No.Patient is discharging to [facility] , Dad states that patient cannot return back home.  At discharge, do you have transportation home?: Yes,  CSW provided a taxi for patient  Do you have the ability to pay for your medications: Yes,  Patient was given some [employer] discount cards to assist with getting his prescriptions.   Release of information consent forms completed and in the chart;  Patient's signature needed at discharge.  Patient to Follow up at:  Follow-up Information     [facility]. Go to.   Specialty: Behavioral Health Why: Please go to this provider for an assessment to obtain therapy and medication management services on Monday through Friday, arrive no later than 7:20 am as services are first come, first served. Contact information: [address] [place] [address] [phone]                Next level of care provider has access to [facility] Link:yes  Safety Planning and Suicide Prevention discussed: Leane Platt (Dad) [phone]     Has patient been referred to the Quitline?: Patient refused referral  Patient has been referred for addiction treatment: N/A  Isabella Bowens, LCSWA 06/24/2022, 10:18 AM

## 2022-06-24 NOTE — [facility] Group Notes (Signed)
BHH Group Notes:  (Nursing/MHT/Case Management/Adjunct)  Date:  06/24/2022  Time:  8:44 AM  Type of Therapy:  Psychoeducational Skills  Participation Level:  Did Not Attend     Audrie Lia Bertis Hustead 06/24/2022, 8:44 AM

## 2022-06-24 NOTE — Group Note (Signed)
Recreation Therapy Group Note   Group Topic:Team Building  Group Date: 06/24/2022 Start Time: 1000 End Time: 1030 Facilitators: Cherl Gorney-McCall, LRT,CTRS Location: [address] Dayroom   Goal Area(s) Addresses:  Patient will effectively work with peer towards shared goal.  Patient will identify skills used to make activity successful.  Patient will identify how skills used during activity can be used to reach post d/c goals.   Group Description: Straw Bridge. In teams of 3-5, patients were given 15 plastic drinking straws and an equal length of masking tape. Using the materials provided, patients were instructed to build a free standing bridge-like structure to suspend an everyday item (ex: puzzle box) off of the floor or table surface. All materials were required to be used by the team in their design. LRT facilitated post-activity discussion reviewing team process. Patients were encouraged to reflect how the skills used in this activity can be generalized to daily life post discharge.    Affect/Mood: N/A   Participation Level: Did not attend    Clinical Observations/Individualized Feedback:     Plan: Continue to engage patient in RT group sessions 2-3x/week.   Cathy Crounse-McCall, LRT,CTRS 06/24/2022 11:31 AM

## 2022-06-24 NOTE — Progress Notes (Signed)
   06/24/22 0523  Sleep  Number of Hours 8

## 2022-06-24 NOTE — Discharge Summary (Signed)
Physician Discharge Summary Note  Patient:  Nathan Mayo is an 31 y.o., male MRN:  [id number] DOB:  11-Mar-1991 Patient phone:  [phone] (home)  Patient address:   [id number] [place] [id number],  Total Time spent with patient: 1.5 hours  Date of Admission:  06/15/2022 Date of Discharge: 06/24/2022  Reason for Admission: Nathan Ramusdward Laufer is a 31 yo Asian male with no prior mental health history who [place] [facility] [place] [facility] [place] after his parents involuntarily committed him for aggressive behaviors towards them and property damage at their home. [facility] for [facility] and stabilization of his mental status.                                             HOSPITAL COURSE During the patient's hospitalization, patient had extensive initial psychiatric evaluation, and follow-up psychiatric evaluations every day.   Patient's psychiatric medications were adjusted on admission as follows: -Started Risperdal M-tabs 1 mg in the mornings and 2 mg nightly for psychosis -Started Trazodone 50 mg PRN for sleep -Started Haldol IM/Cogentin IM/Ativan IM or Risperdal 1 mg tab/Ativan 1 mg tab PRN for agitation-See MAR -Started Hydroxyzine 25 mg every 6 hours PRN   During the hospitalization, other adjustments were made to the patient's psychiatric medication regimen. Depakote 500 mg BID was tried, but level was subtherapeutic at 32 on discharge. Pt is being discharged to a shelter and there are concerns regarding pt being compliant with follow up to get lab levels  after discharge discharge. He will therefore, not be discharged on Depakote. Medications at discharge are as follows:   -Continue Haldol 5 mg PO in the mornings and Haldol 7.5 mg PO for mood stabilization -Continue Effexor to 37.5 mg daily for depressive symptoms -Continue Cogentin 1 mg BID for EPS prophylaxis   Patient's care was discussed during the  interdisciplinary team meeting every day during the hospitalization. The patient was evaluated each day by a clinical provider to ascertain response to treatment.    During the course of this hospitalization, pt has been mostly isolative to his room, and only comes out for meals. He has not been tending to personal hygiene, and is defiant when reinforcements are given for him to participate in activities. Pt is calm until something is being discussed which he is in disagreement, and he gets argumentative and progressively angry. Pt is not motivated to do anything on the unit, and uninterested in unit activities. He states that there is nothing wrong with him and that he is "fine". It became necessary to obtain an order for forced medications due to pt's inability to comply with PO meds initially.   Pt has repeatedly asked during the course of hospitalization to be discharged, and has denied SI, denied HI, denied AVH every day since [facility]. There has been no evidence of delusional thinking since admission. Pt has stated that he is only taking medications because he knows that there is a forced medication order if he does not comply with PO meds, and state that he does not need any medications. There are concerns with compliance with medications after discharge. Pt continues to exhibit poor insight and judgment into his mental status. Pt got angry when asked if he is remorseful for the way he reacted after  his parents refused to buy him the vape. He would not answer. Behaviors are suggestive of an antisocial personality d/o. It is highly unlikely that pt will continue taking his medications after discharge. He has been educated to continue taking his medications after discharge.    Writer discussed pt's current hospital course with father; Writer discussed medications, rationales, benefits and possible side effects. Writer also discussed behaviors which pt has been exhibiting so far on the  unit; discussed that it became necessary on admission to seek forced med order due to refusals to take medications by mouth. Writer also discussed with father the pt's refusals to comply with care such as not attending group sessions, & stating that groups are not beneficial for him, refusals to take showers, or to sit in the day room, along with the fact that behaviors lean more towards an antisocial personality d/o, and that there has been no overt sign of psychosis since hospitalization. Writer also explained to pt's father that plan is to discharge pt today (12/14). Pt's father stated that he does not want pt discharged to his home because he has been dealing with pt's anger and defiance and his refusals to get helped for 9 years. Writer verbalized understanding and stated that plan is to send pt to a shelter today morning, and he verbalized understanding, thanked Clinical research associate, and call ended. Pt will be discharged to the [facility] via taxi. On day of discharge, he continues to deny SI/HI/AVH, denies paranoia and verbally contracts for safety outside of this [facility].   The patient denies having side effects to prescribed psychiatric medication. Labs were reviewed with the patient, and abnormal results were discussed with the patient.  Principal Problem: Unspecified mood (affective) disorder (HCC) Discharge Diagnoses: Principal Problem:   Unspecified mood (affective) disorder (HCC) Active Problems:   Insomnia   Delta-9-tetrahydrocannabinol (THC) dependence (HCC)   Anxiety state  Past Psychiatric History: THC abuse  Past Medical History: History reviewed. No pertinent past medical history. History reviewed. No pertinent surgical history. Family History: History reviewed. No pertinent family history. Family Psychiatric  History: none reported Social History:  Social History   Substance and Sexual Activity  Alcohol Use No     Social History   Substance and Sexual Activity   Drug Use No    Social History   Socioeconomic History   Marital status: Single    Spouse name: Not on file   Number of children: Not on file   Years of education: Not on file   Highest education level: Not on file  Occupational History   Not on file  Tobacco Use   Smoking status: Never   Smokeless tobacco: Never  Vaping Use   Vaping Use: Every day  Substance and Sexual Activity   Alcohol use: No   Drug use: No   Sexual activity: Never  Other Topics Concern   Not on file  Social History Narrative   Not on file   Social Determinants of Health   Financial Resource Strain: Not on file  Food Insecurity: No Food Insecurity (06/15/2022)   Hunger Vital Sign    Worried About Running Out of Food in the Last Year: Never true    Ran Out of Food in the Last Year: Never true  Transportation Needs: No Transportation Needs (06/15/2022)   PRAPARE - Administrator, Civil Service (Medical): No    Lack of Transportation (Non-Medical): No  Physical Activity: Not on file  Stress: Not on  file  Social Connections: Not on file   Physical Findings: AIMS: Facial and Oral Movements Muscles of Facial Expression: None, normal Lips and Perioral Area: None, normal Jaw: None, normal Tongue: None, normal,Extremity Movements Upper (arms, wrists, hands, fingers): None, normal Lower (legs, knees, ankles, toes): None, normal, Trunk Movements Neck, shoulders, hips: None, normal, Overall Severity Severity of abnormal movements (highest score from questions above): None, normal Incapacitation due to abnormal movements: None, normal Patient's awareness of abnormal movements (rate only patient's report): No Awareness, Dental Status Current problems with teeth and/or dentures?: No Does patient usually wear dentures?: No  CIWA:n/a    COWS:  n/a  Musculoskeletal: Strength & Muscle Tone: within normal limits Gait & Station: normal Patient leans: N/A   Psychiatric Specialty  Exam:  Presentation  General Appearance:  Disheveled  Eye Contact: Good  Speech: Clear and Coherent  Speech Volume: Normal  Handedness: Right   Mood and Affect  Mood: Euthymic  Affect: Appropriate   Thought Process  Thought Processes: Coherent  Descriptions of Associations:Intact  Orientation:Full (Time, Place and Person)  Thought Content:Logical  History of Schizophrenia/Schizoaffective disorder:No  Duration of Psychotic Symptoms:Greater than six months  Hallucinations:Hallucinations: None  Ideas of Reference:None  Suicidal Thoughts:Suicidal Thoughts: No  Homicidal Thoughts:Homicidal Thoughts: No   Sensorium  Memory: Immediate Good  Judgment: Fair  Insight: Fair   Art therapist  Concentration: Fair  Attention Span: Fair  Recall: [employer] of Knowledge: Fair  Language: Fair   Psychomotor Activity  Psychomotor Activity: Psychomotor Activity: Normal   Assets  Assets: Resilience   Sleep  Sleep: Sleep: Good    Physical Exam: Physical Exam Constitutional:      Appearance: Normal appearance.  HENT:     Head: Normocephalic.     Nose: Nose normal. No congestion or rhinorrhea.  Eyes:     Pupils: Pupils are equal, round, and reactive to light.  Pulmonary:     Effort: Pulmonary effort is normal.  Musculoskeletal:        General: Normal range of motion.     Cervical back: Normal range of motion.  Neurological:     General: No focal deficit present.     Mental Status: He is alert and oriented to person, place, and time.    Review of Systems  Constitutional: Negative.   HENT: Negative.    Eyes: Negative.   Respiratory: Negative.    Cardiovascular: Negative.   Gastrointestinal: Negative.   Genitourinary: Negative.   Musculoskeletal: Negative.   Skin: Negative.   Neurological: Negative.   Psychiatric/Behavioral:  Positive for substance abuse (Educated on the negative impact of THC use to his mental  health). Negative for depression, hallucinations, memory loss and suicidal ideas. The patient is not nervous/anxious and does not have insomnia.    Blood pressure 96/78, pulse 92, temperature 98 F (36.7 C), resp. rate 16, height 5\' 11"  (1.803 m), weight 62.6 kg, SpO2 99 %. Body mass index is 19.25 kg/m.   Social History   Tobacco Use  Smoking Status Never  Smokeless Tobacco Never   Tobacco Cessation:  A prescription for an FDA-approved tobacco cessation medication was offered at discharge and the patient refused   Blood Alcohol level:  Lab Results  Component Value Date   [facility] <10 06/13/2022   ETH <10 11/28/2021    Metabolic Disorder Labs:  Lab Results  Component Value Date   HGBA1C 5.2 06/13/2022   MPG 103 06/13/2022   No results found for: "PROLACTIN" Lab Results  Component  Value Date   CHOL 156 06/16/2022   TRIG 52 06/16/2022   HDL 49 06/16/2022   CHOLHDL 3.2 06/16/2022   VLDL 10 06/16/2022   LDLCALC 97 06/16/2022   LDLCALC 81 06/13/2022    See Psychiatric Specialty Exam and Suicide Risk Assessment completed by Attending Physician prior to discharge.  Discharge destination:  Home  Is patient on multiple antipsychotic therapies at discharge:  No   Has Patient had three or more failed trials of antipsychotic monotherapy by history:  No  Recommended Plan for Multiple Antipsychotic Therapies: NA   Allergies as of 06/24/2022   No Known Allergies      Medication List     STOP taking these medications    risperiDONE 1 MG disintegrating tablet Commonly known as: RISPERDAL M-TABS       TAKE these medications      Indication  benztropine 1 MG tablet Commonly known as: COGENTIN Take 1 tablet (1 mg total) by mouth 2 (two) times daily.  Indication: Extrapyramidal Reaction caused by Medications, EPS prophylaxis   haloperidol 5 MG tablet Commonly known as: HALDOL Take 1.5 tablets (7.5 mg total) by mouth daily after supper.  Indication: Problems with  Behavior   haloperidol 5 MG tablet Commonly known as: HALDOL Take 1 tablet (5 mg total) by mouth in the morning. Start taking on: June 25, 2022  Indication: Problems with Behavior   venlafaxine XR 37.5 MG 24 hr capsule Commonly known as: EFFEXOR-XR Take 1 capsule (37.5 mg total) by mouth daily with breakfast. Start taking on: June 25, 2022  Indication: mood disorder        Follow-up Information     [facility]. Go to.   Specialty: Behavioral Health Why: Please go to this provider for an assessment to obtain therapy and medication management services on Monday through Friday, arrive no later than 7:20 am as services are first come, first served. Contact information: [address] [place] [address] [phone]               Follow-up recommendations:   The patient is able to verbalize their individual safety plan to this provider.   # It is recommended to the patient to continue psychiatric medications as prescribed, after discharge from the hospital.     # It is recommended to the patient to follow up with your outpatient psychiatric provider and PCP.   # It was discussed with the patient, the impact of alcohol, drugs, tobacco have been there overall psychiatric and medical wellbeing, and total abstinence from substance use was recommended the patient.ed.   # Prescriptions provided or sent directly to preferred pharmacy at discharge. Patient agreeable to plan. Given opportunity to ask questions. Appears to feel comfortable with discharge.    # In the event of worsening symptoms, the patient is instructed to call the crisis hotline (988), 911 and or go to the nearest ED for appropriate evaluation and treatment of symptoms. To follow-up with primary care provider for other medical issues, concerns and or health care needs   # Patient was discharged to the [facility] with a plan to follow up as noted above.    Signed: Starleen Blue, NP 06/24/2022, 5:06 PM

## 2022-06-24 NOTE — BHH Suicide Risk Assessment (Addendum)
Suicide Risk Assessment  Discharge Assessment    [facility] Discharge Suicide Risk Assessment   Principal Problem: Unspecified mood (affective) disorder (HCC) Discharge Diagnoses: Principal Problem:   Unspecified mood (affective) disorder (HCC) Active Problems:   Insomnia   Delta-9-tetrahydrocannabinol (THC) dependence (HCC)   Anxiety state  Reason for admission: Nathan Mayo is a 31 yo Asian male with no prior mental health history who was [place] [facility] [place] [facility] on 06[facility]13/22 after his parents involuntarily committed him for aggressive behaviors towards them and property damage at their home. Pt was transferred to this [facility] for treatment and stabilization of his mental status.                                       HOSPITAL COURSE During the patient's hospitalization, patient had extensive initial psychiatric evaluation, and follow-up psychiatric evaluations every day.  Patient's psychiatric medications were adjusted on admission as follows: -Started Risperdal M-tabs 1 mg in the mornings and 2 mg nightly for psychosis -Started Trazodone 50 mg PRN for sleep -Started Haldol IM/Cogentin IM/Ativan IM or Risperdal 1 mg tab/Ativan 1 mg tab PRN for agitation-See MAR -Started Hydroxyzine 25 mg every 6 hours PRN  During the hospitalization, other adjustments were made to the patient's psychiatric medication regimen. Depakote 500 mg BID was tried, but level was subtherapeutic at 32 on discharge. Pt is being discharged to a shelter and there are concerns regarding pt being compliant with follow up to get lab levels  after discharge discharge. He will therefore, not be discharged on Depakote. Medications at discharge are as follows:  -Continue Haldol 5 mg PO in the mornings and Haldol 7.5 mg PO for mood stabilization -Continue Effexor to 37.5 mg daily for depressive symptoms -Continue Cogentin 1 mg BID for EPS prophylaxis  Patient's care was discussed during the  interdisciplinary team meeting every day during the hospitalization. The patient was evaluated each day by a clinical provider to ascertain response to treatment.   During the course of this hospitalization, pt has been mostly isolative to his room, and only comes out for meals. He has not been tending to personal hygiene, and is defiant when reinforcements are given for him to participate in activities. Pt is calm until something is being discussed which he is in disagreement, and he gets argumentative and progressively angry. Pt is not motivated to do anything on the unit, and uninterested in unit activities. He states that there is nothing wrong with him and that he is "fine". It became necessary to obtain an order for forced medications due to pt's inability to comply with PO meds initially.  Pt has repeatedly asked during the course of hospitalization to be discharged, and has denied SI, denied HI, denied AVH every day since being admitted to this [facility]. There has been no evidence of delusional thinking since admission. Pt has stated that he is only taking medications because he knows that there is a forced medication order if he does not comply with PO meds, and state that he does not need any medications. There are concerns with compliance with medications after discharge. Pt continues to exhibit poor insight and judgment into his mental status. Pt got angry when asked if he is remorseful for the way he reacted after his parents refused to buy him the vape. He would not answer. Behaviors are suggestive of an antisocial personality  d/o. It is highly unlikely that pt will continue taking his medications after discharge. He has been educated to continue taking his medications after discharge.   Writer discussed pt's current hospital course with father; Writer discussed medications, rationales, benefits and possible side effects. Writer also discussed behaviors which pt has been exhibiting so far on the unit;  discussed that it became necessary on admission to seek forced med order due to refusals to take medications by mouth. Writer also discussed with father the pt's refusals to comply with care such as not attending group sessions, & stating that groups are not beneficial for him, refusals to take showers, or to sit in the day room, along with the fact that behaviors lean more towards an antisocial personality d/o, and that there has been no overt sign of psychosis since hospitalization. Writer also explained to pt's father that plan is to discharge pt today (12/14). Pt's father stated that he does not want pt discharged to his home because he has been dealing with pt's anger and defiance and his refusals to get helped for 9 years. Writer verbalized understanding and stated that plan is to send pt to a shelter today morning, and he verbalized understanding, thanked Clinical research associate, and call ended. Pt will be discharged to the [facility] via taxi. On day of discharge, he continues to deny SI/HI/AVH, denies paranoia and verbally contracts for safety outside of this [facility].  The patient denies having side effects to prescribed psychiatric medication. Labs were reviewed with the patient, and abnormal results were discussed with the patient.  Total Time spent with patient: 30 minutes  Musculoskeletal: Strength & Muscle Tone: within normal limits Gait & Station: normal Patient leans: N/A  Psychiatric Specialty Exam  Presentation  General Appearance:  Disheveled  Eye Contact: Good  Speech: Clear and Coherent  Speech Volume: Normal  Handedness: Right  Mood and Affect  Mood: Euthymic  Duration of Depression Symptoms: No data recorded Affect: Appropriate  Thought Process  Thought Processes: Coherent  Descriptions of Associations:Intact  Orientation:Full (Time, Place and Person)  Thought Content:Logical  History of Schizophrenia/Schizoaffective disorder:No  Duration  of Psychotic Symptoms:Greater than six months  Hallucinations:Hallucinations: None  Ideas of Reference:None  Suicidal Thoughts:Suicidal Thoughts: No  Homicidal Thoughts:Homicidal Thoughts: No  Sensorium  Memory: Immediate Good  Judgment: Fair  Insight: Fair  Art therapist  Concentration: Fair  Attention Span: Fair  Recall: [employer] of Knowledge: Fair  Language: Fair  Psychomotor Activity  Psychomotor Activity: Psychomotor Activity: Normal  Assets  Assets: Resilience  Sleep  Sleep: Sleep: Good   Physical Exam: Physical Exam Constitutional:      Appearance: Normal appearance.  HENT:     Head: Normocephalic.     Nose: Nose normal. No congestion.  Eyes:     Pupils: Pupils are equal, round, and reactive to light.  Pulmonary:     Effort: Pulmonary effort is normal.  Musculoskeletal:     Cervical back: Normal range of motion.  Neurological:     Mental Status: He is alert and oriented to person, place, and time.    Review of Systems  Constitutional:  Negative for chills and fever.  HENT: Negative.    Eyes:  Negative for blurred vision.  Respiratory:  Negative for cough.   Cardiovascular:  Negative for chest pain.  Gastrointestinal:  Negative for heartburn.  Genitourinary: Negative.   Musculoskeletal:  Negative for myalgias.  Skin: Negative.   Neurological:  Negative for dizziness.  Psychiatric/Behavioral:  Positive  for substance abuse. Negative for depression, hallucinations, memory loss and suicidal ideas. The patient is not nervous/anxious and does not have insomnia.    Blood pressure 96/78, pulse 92, temperature 98 F (36.7 C), resp. rate 16, height 5\' 11"  (1.803 m), weight 62.6 kg, SpO2 99 %. Body mass index is 19.25 kg/m.  Mental Status Per Nursing Assessment::   On Admission:  NA  Demographic Factors:  Male  Loss Factors: NA  Historical Factors: NA  Risk Reduction Factors:   NA  Continued Clinical Symptoms:  Pt  denies SI, denies HI, denies AVH, denies paranoia. He is verbally contracting for safety outside of this [facility].  Cognitive Features That Contribute To Risk:  None    Suicide Risk:  Mild:  There are no identifiable suicide plans, no associated intent, mild dysphoria and related symptoms, good self-control (both objective and subjective assessment), few other risk factors, and identifiable protective factors, including available and accessible social support.    Follow-up Information     [facility]. Go to.   Specialty: Behavioral Health Why: Please go to this provider for an assessment to obtain therapy and medication management services on Monday through Friday, arrive no later than 7:20 am as services are first come, first served. Contact information: [address] [place] [phone]               Plan Of Care/Follow-up recommendations:  The patient is able to verbalize their individual safety plan to this provider.  # It is recommended to the patient to continue psychiatric medications as prescribed, after discharge from the hospital.    # It is recommended to the patient to follow up with your outpatient psychiatric provider and PCP.  # It was discussed with the patient, the impact of alcohol, drugs, tobacco have been there overall psychiatric and medical wellbeing, and total abstinence from substance use was recommended the patient.ed.  # Prescriptions provided or sent directly to preferred pharmacy at discharge. Patient agreeable to plan. Given opportunity to ask questions. Appears to feel comfortable with discharge.    # In the event of worsening symptoms, the patient is instructed to call the crisis hotline (988), 911 and or go to the nearest ED for appropriate evaluation and treatment of symptoms. To follow-up with primary care provider for other medical issues, concerns and or health care needs  # Patient was  discharged to the [facility] with a plan to follow up as noted above.   [facility], NP 06/24/2022, 9:37 AM

## 2022-06-26 ENCOUNTER — Ambulatory Visit (HOSPITAL_COMMUNITY)
Admission: EM | Admit: 2022-06-26 | Discharge: 2022-06-28 | Disposition: A | Discharge status: Other Acute Inpatient Hospital | Payer: No Payment, Other | Attending: Family | Admitting: Family

## 2022-06-26 DIAGNOSIS — Z79899 Other long term (current) drug therapy: Secondary | ICD-10-CM | POA: Insufficient documentation

## 2022-06-26 DIAGNOSIS — Z1152 Encounter for screening for COVID-19: Secondary | ICD-10-CM | POA: Diagnosis not present

## 2022-06-26 DIAGNOSIS — R45851 Suicidal ideations: Secondary | ICD-10-CM | POA: Insufficient documentation

## 2022-06-26 DIAGNOSIS — Z91148 Patient's other noncompliance with medication regimen for other reason: Secondary | ICD-10-CM | POA: Insufficient documentation

## 2022-06-26 DIAGNOSIS — F39 Unspecified mood [affective] disorder: Secondary | ICD-10-CM | POA: Insufficient documentation

## 2022-06-26 LAB — COMPREHENSIVE METABOLIC PANEL
ALT: 14 U/L (ref 0–44)
AST: 16 U/L (ref 15–41)
Albumin: 4.1 g/dL (ref 3.5–5.0)
Alkaline Phosphatase: 50 U/L (ref 38–126)
Anion gap: 8 (ref 5–15)
BUN: 17 mg/dL (ref 6–20)
CO2: 26 mmol/L (ref 22–32)
Calcium: 8.9 mg/dL (ref 8.9–10.3)
Chloride: 107 mmol/L (ref 98–111)
Creatinine, Ser: 0.99 mg/dL (ref 0.61–1.24)
GFR, Estimated: >60 mL/min (ref >60)
Glucose, Bld: 87 mg/dL (ref 70–99)
Potassium: 4 mmol/L (ref 3.5–5.1)
Sodium: 141 mmol/L (ref 135–145)
Total Bilirubin: 0.7 mg/dL (ref 0.3–1.2)
Total Protein: 6.7 g/dL (ref 6.5–8.1)

## 2022-06-26 LAB — POC SARS CORONAVIRUS 2 AG: SARSCOV2ONAVIRUS 2 AG: NEGATIVE

## 2022-06-26 LAB — POCT URINE DRUG SCREEN - MANUAL ENTRY (I-SCREEN)
POC Amphetamine UR: NOT DETECTED
POC Buprenorphine (BUP): NOT DETECTED
POC Cocaine UR: NOT DETECTED
POC Marijuana UR: NOT DETECTED
POC Methadone UR: NOT DETECTED
POC Methamphetamine UR: NOT DETECTED
POC Morphine: NOT DETECTED
POC Oxazepam (BZO): NOT DETECTED
POC Oxycodone UR: NOT DETECTED
POC Secobarbital (BAR): NOT DETECTED

## 2022-06-26 LAB — RESP PANEL BY RT-PCR (RSV, FLU A&B, COVID)  RVPGX2
Influenza A by PCR: NEGATIVE
Influenza B by PCR: NEGATIVE
Resp Syncytial Virus by PCR: NEGATIVE
SARS Coronavirus 2 by RT PCR: NEGATIVE

## 2022-06-26 MED ORDER — VENLAFAXINE HCL 37.5 MG PO TABS
37.5000 mg | ORAL_TABLET | Freq: Once | ORAL | Status: DC
  Filled 2022-06-26: qty 1

## 2022-06-26 MED ORDER — RISPERIDONE 1 MG PO TBDP
1.0000 mg | ORAL_TABLET | Freq: Two times a day (BID) | ORAL | Status: DC
  Filled 2022-06-26 (×4): qty 1

## 2022-06-26 MED ORDER — ALUM & MAG HYDROXIDE-SIMETH 200-200-20 MG/5ML PO SUSP: 30.0000 mL | ORAL | Status: DC | PRN

## 2022-06-26 MED ORDER — LORAZEPAM 2 MG/ML IJ SOLN: 2.0000 mg | Freq: Two times a day (BID) | INTRAMUSCULAR | Status: DC

## 2022-06-26 MED ORDER — DIPHENHYDRAMINE HCL 50 MG/ML IJ SOLN: 50.0000 mg | Freq: Once | INTRAMUSCULAR | Status: DC

## 2022-06-26 MED ORDER — MAGNESIUM HYDROXIDE 400 MG/5ML PO SUSP: 30.0000 mL | Freq: Every day | ORAL | Status: DC | PRN

## 2022-06-26 MED ORDER — OLANZAPINE 5 MG PO TABS: 5.0000 mg | ORAL_TABLET | Freq: Two times a day (BID) | ORAL | Status: DC

## 2022-06-26 MED ORDER — TRAZODONE HCL 50 MG PO TABS
50.0000 mg | ORAL_TABLET | Freq: Every evening | ORAL | Status: DC | PRN
  Administered 2022-06-26: 50 mg via ORAL
  Filled 2022-06-26 (×2): qty 1

## 2022-06-26 MED ORDER — LORAZEPAM 1 MG PO TABS
2.0000 mg | ORAL_TABLET | Freq: Two times a day (BID) | ORAL | Status: DC
  Filled 2022-06-26 (×3): qty 2

## 2022-06-26 MED ORDER — HALOPERIDOL 5 MG PO TABS
5.0000 mg | ORAL_TABLET | Freq: Once | ORAL | Status: DC
  Filled 2022-06-26: qty 1

## 2022-06-26 MED ORDER — ACETAMINOPHEN 325 MG PO TABS: 650.0000 mg | ORAL_TABLET | Freq: Four times a day (QID) | ORAL | Status: DC | PRN

## 2022-06-26 NOTE — BH Assessment (Signed)
Comprehensive Clinical Assessment (CCA) Note  06/26/2022 Nathan Mayo [id number]  DISPOSITION: Per NP Hillery Jacks pt is recommended for observation at the [facility] [address]   The patient demonstrates the following risk factors for suicide: Chronic risk factors for suicide include: psychiatric disorder of GAD and substance use disorder. Acute risk factors for suicide include: family or marital conflict and social withdrawal/isolation. Protective factors for this patient include: positive social support, responsibility to others (children, family), and hope for the future. Considering these factors, the overall suicide risk at this point appears to be low. Patient is appropriate for outpatient follow up.   Pt is a 31 yo male who presented voluntarily and unaccompanied after a security guard at [employer] called police per pt. Pt could not explain why police were called. Pt denied any previous diagnoses but stated that he had just been diacharged from [facility] about 2 days ago after a stay of about a week. Pt stated that his medications that were prescribed for him to take after discharge "made me so I can't feel my immune system." Pt stated that he stopped taking his meeications soon after discharge. Pt denied seeing an OP therapist. Pt stated that not being able to feel his immune system made him feel suicidal. Pt denied any specific plan or true intent. Pt denied HI, NSSH, AVH, paranoia and any substance use. Pt stated he lives with his parents. Pt stated he is sleeping and eating normally.   Cannabis dependence was reported in a similar assessment on 06/16/22. Pt stated he lives with his parents and is unemployed. Pt state he does not currently have an OP therapist and is not taking his prescribed medication. Pt reported no medical conditions. Pt reported that he had been discharged about 2 days ago from [facility] and had stayed about a week. Pt reported that he has had other IP psychiatric admissions but could not  give details about dates and places.   Pt is calm, cooperative, alert and seems oriented x 5. Pt's speech and movement seem normal. Pt's mood seems euthymic, and he denies any sad feelings but his flat affect seems incongruent. Pt's judgment and insight seemed impaired.    Chief Complaint:  Chief Complaint  Patient presents with   Addiction Problem   Visit Diagnosis:  GAD Cannabis Dependence per hx    CCA Screening, Triage and Referral (STR)  Patient Reported Information How did you hear about [place]? Other (Comment) (Security at [employer] police)  What Is the Reason for Your Visit/Call Today? Pt is a 31 yo male who presented voluntarily and unaccompanied after a security guard at [employer] called police per pt. Pt could not explain why police were called. Pt denied any previous diagnoses but stated that he had just been diacharged from [facility] about 2 days ago after a stay of about a week. Pt stated that his medications that were prescribed for him to take after discharge "made me so I can't feel my immune system." Pt stated that he stopped taking his meeications soon after discharge. Pt denied seeing an OP therapist. Pt stated that not being able to feel his immune system made him feel suicidal. Pt denied any specific plan or true intent. Pt denied HI, NSSH, AVH, paranoia and any substance use. Pt stated he lives with his parents. Pt stated he is sleeping and eating normally.  How Long Has This Been Causing You Problems? > than 6 months  What Do You Feel Would Help  You the Most Today? Treatment for Depression or other mood problem   Have You Recently Had Any Thoughts About Hurting Yourself? Yes  Are You Planning to Commit Suicide/Harm Yourself At This time? No   Flowsheet Row [facility]) [facility]  C-SSRS RISK CATEGORY Low Risk No Risk No Risk       Have you Recently Had Thoughts About Hurting Someone Karolee Ohslse? No  Are You Planning to Harm Someone at This Time? No  Explanation: N/A   Have You Used Any Alcohol or Drugs in the Past 24 Hours? No  What Did You Use and How Much? N/A   Do You Currently Have a Therapist/Psychiatrist? No  Name of Therapist/Psychiatrist:    Have You Been Recently Discharged From Any Office Practice or Programs? No  Explanation of Discharge From Practice/Program: N/A     CCA Screening Triage Referral Assessment Type of Contact: Face-to-Face  Telemedicine Service Delivery:   Is this Initial or Reassessment?   Date Telepsych consult ordered in [facility]:    Time Telepsych consult ordered in [facility]:    [facility]  [facility]   Collateral Involvement: none at this time   Does Patient Have a Automotive engineerCourt Appointed Legal Guardian? No  Legal Guardian Contact Information: N/A  Copy of Legal Guardianship Form: -- (N/A)  Legal Guardian Notified of Arrival: -- (N/A)  Legal Guardian Notified of Pending Discharge: -- (N/A)  If Minor and Not Living with Parent(s), Who has Custody? N/A  Is CPS involved or ever been involved? Never (none reported)  Is APS involved or ever been involved? Never (none reported)   Patient Determined To Be At Risk for Harm To Self or Others Based on Review of Patient Reported Information or Presenting Complaint? No  Method: No Plan  Availability of Means: No access or NA (denied access)  Intent: Vague intent or NA  Notification Required: No need or identified person  Additional Information for Danger to Others Potential: -- (N/A)  Additional Comments for Danger to Others Potential: denied previous attempts  Are There Guns or Other Weapons in Your Home? No (denied)  Types of Guns/Weapons: na  Are These Weapons Safely Secured?                             -- (na)  Who Could Verify You Are Able To Have These Secured: N/A  Do You Have any Outstanding Charges, Pending Court Dates, Parole/Probation? none reported-denied  Contacted To Inform of Risk of Harm To Self or Others: Law Enforcement    Does Patient Present under Involuntary Commitment? No    [place]   Patient Currently Receiving the Following Services: Not Receiving Services (per pt)   Determination of Need: Urgent (48 hours) (Per NP Hillery Jacksanika Lewis [facility]   [facility] Ref[facility]     CCA Biopsychosocial Patient Reported Schizophrenia/Schizoaffective Diagnosis in Past: No (none noted)   Strengths: Pt reports he is hardworking   Mental Health Symptoms Depression:   None   Duration of Depressive symptoms:    Mania:   None   Anxiety:    None   Psychosis:   None   Duration of Psychotic symptoms:  Trauma:   None   Obsessions:   None   Compulsions:   None   Inattention:   None   Hyperactivity/Impulsivity:   N/A   Oppositional/Defiant Behaviors:   N/A   Emotional Irregularity:   None   Other Mood/Personality Symptoms:   N/A    Mental Status Exam Appearance and self-care  Stature:   Tall   Weight:   Average weight   Clothing:   Casual   Grooming:   Normal   Cosmetic use:   None   Posture/gait:   Normal   Motor activity:   Not Remarkable   Sensorium  Attention:   Distractible   Concentration:   Normal   Orientation:   X5   Recall/memory:   Normal   Affect and Mood  Affect:   Flat   Mood:   Euthymic   Relating  Eye contact:   Normal   Facial expression:   Responsive   Attitude toward examiner:   Cooperative   Thought and Language  Speech flow:  Normal   Thought content:   Appropriate to Mood and Circumstances   Preoccupation:   None   Hallucinations:   None   Organization:   Linear    Company secretary of Knowledge:   Average   Intelligence:   Average   Abstraction:   Normal   Judgement:   Poor   Reality Testing:   Adequate   Insight:   Lacking   Decision Making:   Impulsive   Social Functioning  Social Maturity:   Isolates   Social Judgement:   Normal   Stress  Stressors:   Family conflict   Coping Ability:   Deficient supports   Skill Deficits:   None   Supports:   Support needed     Religion: Religion/Spirituality Are You A Religious Person?: No How Might This Affect Treatment?: N/A  Leisure/Recreation: Leisure / Recreation Do You Have Hobbies?: No Leisure and Hobbies: Patient states "no"  Exercise/Diet: Exercise/Diet Do You Exercise?: No Have You Gained or Lost A Significant Amount of Weight in the Past Six Months?: No Do You Follow a Special Diet?: No Do You Have Any Trouble Sleeping?: No   CCA Employment/Education Employment/Work Situation: Employment / Work Situation Employment Situation: Unemployed Patient's Job has Been Impacted by Current Illness: No Has Patient ever Been in Equities trader?: No  Education: Education Last Grade Completed: 12 Did You Product manager?: No Did You Have An Individualized Education Program (IIEP): No Did You Have Any Difficulty At [employer]?: No   CCA Family/Childhood History Family and Relationship History: Family history Marital status: Single Does patient have children?: No  Childhood History:  Childhood History By whom was/is the patient raised?: Both parents Did patient suffer any verbal/emotional/physical/sexual abuse as a child?: No Has patient ever been sexually abused/assaulted/raped as an adolescent or adult?: No Witnessed domestic violence?: No Has patient been affected by domestic violence as an adult?: No       CCA Substance Use Alcohol/Drug Use: Alcohol / Drug Use Pain Medications: See MAR Prescriptions: See MAR Over the Counter: See  MAR History of alcohol / drug use?:  (Per chart Cannabis dependence is noted on 06/16/22) Longest period of sobriety (when/how long): N/A Negative Consequences of Use:  (N/A) Withdrawal Symptoms:  (N/A)                         ASAM's:  Six Dimensions of Multidimensional Assessment  Dimension  1:  Acute Intoxication and/or Withdrawal Potential:      Dimension 2:  Biomedical Conditions and Complications:      Dimension 3:  Emotional, Behavioral, or Cognitive Conditions and Complications:     Dimension 4:  Readiness to Change:     Dimension 5:  Relapse, Continued use, or Continued Problem Potential:     Dimension 6:  Recovery/Living Environment:     ASAM Severity Score:    ASAM Recommended Level of Treatment: ASAM Recommended Level of Treatment:  (N/A)   Substance use Disorder (SUD) Substance Use Disorder (SUD)  Checklist Symptoms of Substance Use:  (N/A)  Recommendations for Services/Supports/Treatments: Recommendations for Services/Supports/Treatments Recommendations For Services/Supports/Treatments:  (N/A)  Discharge Disposition:    DSM5 Diagnoses: Patient Active Problem List   Diagnosis Date Noted   Unspecified mood (affective) disorder (HCC) 06/23/2022   Insomnia 06/16/2022   Delta-9-tetrahydrocannabinol (THC) dependence (HCC) 06/16/2022   Anxiety state 06/16/2022   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 Jimmye Norman, MS, [facility], [facility] Triage Specialist [facility]

## 2022-06-26 NOTE — Progress Notes (Signed)
Pt is constantly pacing the unit and up to the nurses desk for multiple requests. First patient asks if he can be discharged immediately, process of admission and discharge explained to pt. He states '' ok, I'll talk to the provider in the morning then''  Moments later patient comes back to ask same thing about being seen in am. Pt is restless , disorganized and requires frequent redirection for his needs.  Pt asking for TV to be turned down despite peers watching it at appropriate volume.  Pt has continued to refuse to take any medication. Remains able to be verbally redirected at this time. Will con't to monitor.

## 2022-06-26 NOTE — ED Notes (Signed)
Pt A&O x 4, resting at present, watching TV at present. Calm & cooperative. No distress noted. Denies SI, HI or AVH.  Monitoring for safety.

## 2022-06-26 NOTE — ED Notes (Signed)
Pt continues to sit up in bed.  Appears at this time to be responding to internal stimuli. Pt again refused to take mediations when offered. Will continue to monitor for safety.

## 2022-06-26 NOTE — Progress Notes (Signed)
Pt has been observed restless, disorganized, staring at staff continually, earlier reported '' I'm just going to have to kill myself if this pain doesn't stop. '' Pt making somatic complaints about various things,and when offered pain meds then denies being in pain.  Pt asked for sleeping medication, when writer offered medication then he refused, stating no meds. No meds. I will never take anything else. ''  Attempted to educate patient however he is paranoid and suspicious.

## 2022-06-26 NOTE — ED Notes (Signed)
Pt reports " whatever medication they were giving me at the other hospital made me worse"  pt has blunted affect with good eye contact.   He denies SI, HI or AVH at time of admission. He was searched with no contraband found.  His belongings were placed in locker 27. Pt was given scrub bottoms and oriented to milieu and room 4.

## 2022-06-26 NOTE — ED Notes (Signed)
Patient continues to be restless. Patient denied medication. Patient safe on unit with continued monitoring.

## 2022-06-26 NOTE — Discharge Instructions (Addendum)

## 2022-06-26 NOTE — ED Notes (Signed)
Pt sleeping at present, no distress noted.  Monitoring for safety. 

## 2022-06-26 NOTE — ED Provider Notes (Cosign Needed)
[facility] Continuous Assessment Admission H&P  Date: 06/26/22 Patient Name: Nathan Mayo MRN: [id number] Chief Complaint: Suicidal ideations     Diagnoses:  Final diagnoses:  Unspecified mood (affective) disorder (HCC)    HPI: Nathan Mayo 31 year old Asian male presents to [facility] urgent care reporting " these medications is making me sick."  States he was recently discharged from inpatient admission and was initiated on Haldol and feels that the Haldol is attacking his immune system.  Reports he has not been taking medications as indicated.  Reports feeling suicidal denying plan or intent.  He denies auditory or visual hallucinations.  Patient reports he resides with his parents.  Denies that he is currently employed.  Boaz has a charted history with cannabis induced psychotic disorder, generalized anxiety disorder, unspecified mood disorder and undifferentiated schizophrenia.  Recently discharged on Haldol 5 mg p.o. twice daily, Cogentin 1 mg bid and Effexor 37.5 mg daily, Risperdal 1 mg daily and 2 mg nightly.  Denies that he has been compliant since discharge on 12/14.  During evaluation Nathan Mayo is sitting in no acute distress. he is alert/oriented x 3 calm/cooperative; anxious and slightly disheveledhe is speaking in a clear tone at moderate volume, and normal pace; with good eye contact.  His thought process is coherent and relevant. Patient appears to be responding to internal stimuli and may experiencing delusional thought content stated " these medications are messing up my immune system." He denied homicidal ideation and has remained calm throughout assessment and has answered questions appropriately.      PHQ 2-9:  Flowsheet Row ED from 11/28/2021 in [facility]  Thoughts that you would be better off dead, or of hurting yourself in some way Not at all  PHQ-9 Total Score 2       Flowsheet Row Ad[facility] ([facility]) [facility] from [facility]13/2022 in [facility] ED from 11/28/2021 in [facility]  C-SSRS RISK CATEGORY No Risk No Risk No Risk        Total Time spent with patient: 15 minutes  Musculoskeletal  Strength & Muscle Tone: within normal limits Gait & Station: normal Patient leans: N/A  Psychiatric Specialty Exam  Presentation General Appearance:  Disheveled  Eye Contact: Good  Speech: Clear and Coherent  Speech Volume: Normal  Handedness: Right   Mood and Affect  Mood: Euthymic  Affect: Appropriate   Thought Process  Thought Processes: Coherent  Descriptions of Associations:Intact  Orientation:Full (Time, Place and Person)  Thought Content:Logical  Diagnosis of Schizophrenia or Schizoaffective disorder in past: No  Duration of Psychotic Symptoms: Greater than six months  Hallucinations:No data recorded Ideas of Reference:None  Suicidal Thoughts:No data recorded Homicidal Thoughts:No data recorded  Sensorium  Memory: Immediate Good  Judgment: Fair  Insight: Fair   Chartered certified accountant: Fair  Attention Span: Fair  Recall: [employer] of Knowledge: Fair  Language: Fair   Psychomotor Activity  Psychomotor Activity:No data recorded  Assets  Assets: Resilience   Sleep  Sleep:No data recorded  No data recorded  Physical Exam Vitals and nursing note reviewed.  Constitutional:      Appearance: Normal appearance.  Cardiovascular:     Rate and Rhythm: Normal rate and regular rhythm.  Neurological:     Mental Status: He is alert and oriented to person, place, and time.  Psychiatric:        Mood and Affect: Mood  normal.        Thought Content: Thought content normal.    Review of Systems  Psychiatric/Behavioral:  Positive for suicidal ideas. The patient is nervous/anxious.   All other systems reviewed and are  negative.   Blood pressure 113/70, pulse 85, temperature 98.4 F (36.9 C), temperature source Oral, resp. rate 18, height 5\' 9"  (1.753 m), weight 130 lb (59 kg), SpO2 97 %. Body mass index is 19.2 kg/m.  Past Psychiatric History:   Is the patient at risk to self? No  Has the patient been a risk to self in the past 6 months? No .    Has the patient been a risk to self within the distant past? No   Is the patient a risk to others? No   Has the patient been a risk to others in the past 6 months? No   Has the patient been a risk to others within the distant past? No   Past Medical History: No past medical history on file. No past surgical history on file.  Family History: No family history on file.  Social History:  Social History   Socioeconomic History   Marital status: Single    Spouse name: Not on file   Number of children: Not on file   Years of education: Not on file   Highest education level: Not on file  Occupational History   Not on file  Tobacco Use   Smoking status: Never   Smokeless tobacco: Never  Vaping Use   Vaping Use: Every day  Substance and Sexual Activity   Alcohol use: No   Drug use: No   Sexual activity: Never  Other Topics Concern   Not on file  Social History Narrative   Not on file   Social Determinants of Health   Financial Resource Strain: Not on file  Food Insecurity: No Food Insecurity (06/15/2022)   Hunger Vital Sign    Worried About Running Out of Food in the Last Year: Never true    Ran Out of Food in the Last Year: Never true  Transportation Needs: No Transportation Needs (06/15/2022)   PRAPARE - Administrator, Civil ServiceTransportation    Lack of Transportation (Medical): No    Lack of Transportation (Non-Medical): No  Physical Activity: Not on file  Stress: Not on file  Social Connections: Not on file  Intimate Partner Violence: Not At Risk (06/15/2022)   Humiliation, Afraid, Rape, and Kick questionnaire    Fear of Current or Ex-Partner: No     Emotionally Abused: No    Physically Abused: No    Sexually Abused: No    SDOH:  SDOH Screenings   Food Insecurity: No Food Insecurity (06/15/2022)  Housing: Low Risk  (06/15/2022)  Transportation Needs: No Transportation Needs (06/15/2022)  Utilities: Not At Risk (06/15/2022)  Alcohol Screen: Low Risk  (06/15/2022)  Depression (PHQ2-9): Low Risk  (11/29/2021)  Tobacco Use: Low Risk  (06/15/2022)    Last Labs:  Admission on 06/15/2022, Discharged on 06/24/2022  Component Date Value Ref Range Status   Cholesterol 06/16/2022 156  0 - 200 mg/dL Final   Triglycerides 11/91/478212/12/2021 52  <150 mg/dL Final   HDL 95/62/130812/12/2021 49  >40 mg/dL Final   Total CHOL/HDL Ratio 06/16/2022 3.2  RATIO Final   VLDL 06/16/2022 10  0 - 40 mg/dL Final   LDL Cholesterol 06/16/2022 97  0 - 99 mg/dL Final   Comment:        Total Cholesterol/HDL:CHD Risk Coronary Heart Disease Risk  Table                     Men   Women  1/2 Average Risk   3.4   3.3  Average Risk       5.0   4.4  2 X Average Risk   9.6   7.1  3 X Average Risk  23.4   11.0        Use the calculated Patient Ratio above and the CHD Risk Table to determine the patient's CHD Risk.        ATP III CLASSIFICATION (LDL):  <100     mg/dL   Optimal  161-096  mg/dL   Near or Above                    Optimal  130-159  mg/dL   Borderline  045-409  mg/dL   High  >811     mg/dL   Very High Performed at [facility], [address] [place], [place] [address]    SARS Coronavirus 2 by RT PCR 06/22/2022 NEGATIVE  NEGATIVE Final   Comment: (NOTE) SARS-CoV-2 target nucleic acids are NOT DETECTED.  The SARS-CoV-2 RNA is generally detectable in upper respiratory specimens during the acute phase of infection. The lowest concentration of SARS-CoV-2 viral copies this assay can detect is 138 copies/mL. A negative result does not preclude SARS-Cov-2 infection and should not be used as the sole basis for treatment or other patient management  decisions. A negative result may occur with  improper specimen collection/handling, submission of specimen other than nasopharyngeal swab, presence of viral mutation(s) within the areas targeted by this assay, and inadequate number of viral copies(<138 copies/mL). A negative result must be combined with clinical observations, patient history, and epidemiological information. The expected result is Negative.  Fact Sheet for Patients:  BloggerCourse.com  Fact Sheet for Healthcare Providers:  SeriousBroker.it  This test is no                          t yet approved or cleared by the [place] FDA and  has been authorized for detection and/or diagnosis of SARS-CoV-2 by FDA under an Emergency Use Authorization (EUA). This EUA will remain  in effect (meaning this test can be used) for the duration of the COVID-19 declaration under Section 564(b)(1) of the Act, 21 U.S.C.section 360bbb-3(b)(1), unless the authorization is terminated  or revoked sooner.       Influenza A by PCR 06/22/2022 NEGATIVE  NEGATIVE Final   Influenza B by PCR 06/22/2022 NEGATIVE  NEGATIVE Final   Comment: (NOTE) The Xpert Xpress SARS-CoV-2/FLU/RSV plus assay is intended as an aid in the diagnosis of influenza from Nasopharyngeal swab specimens and should not be used as a sole basis for treatment. Nasal washings and aspirates are unacceptable for Xpert Xpress SARS-CoV-2/FLU/RSV testing.  Fact Sheet for Patients: BloggerCourse.com  Fact Sheet for Healthcare Providers: SeriousBroker.it  This test is not yet approved or cleared by the [place] FDA and has been authorized for detection and/or diagnosis of SARS-CoV-2 by FDA under an Emergency Use Authorization (EUA). This EUA will remain in effect (meaning this test can be used) for the duration of the COVID-19 declaration under Section 564(b)(1) of the Act,  21 U.S.C. section 360bbb-3(b)(1), unless the authorization is terminated or revoked.     Resp Syncytial Virus by PCR 06/22/2022 NEGATIVE  NEGATIVE Final   Comment: (NOTE)  Fact Sheet for Patients: BloggerCourse.com  Fact Sheet for Healthcare Providers: SeriousBroker.it  This test is not yet approved or cleared by the [place] FDA and has been authorized for detection and/or diagnosis of SARS-CoV-2 by FDA under an Emergency Use Authorization (EUA). This EUA will remain in effect (meaning this test can be used) for the duration of the COVID-19 declaration under Section 564(b)(1) of the Act, 21 U.S.C. section 360bbb-3(b)(1), unless the authorization is terminated or revoked.  Performed at [facility], [address] [place], [place] [address]    Valproic Acid Lvl 06/24/2022 32 (L)  50.0 - 100.0 ug/mL Final   Performed at [facility], [address], [place], [place] [address]  Admission on 06/13/2022, Discharged on 06/15/2022  Component Date Value Ref Range Status   SARSCOV2ONAVIRUS 2 AG 06/13/2022 NEGATIVE  NEGATIVE Final   Comment: (NOTE) SARS-CoV-2 antigen NOT DETECTED.   Negative results are presumptive.  Negative results do not preclude SARS-CoV-2 infection and should not be used as the sole basis for treatment or other patient management decisions, including infection  control decisions, particularly in the presence of clinical signs and  symptoms consistent with COVID-19, or in those who have been in contact with the virus.  Negative results must be combined with clinical observations, patient history, and epidemiological information. The expected result is Negative.  Fact Sheet for Patients: https://www.jennings-kim.com/  Fact Sheet for Healthcare Providers: https://alexander-rogers.biz/  This test is not yet approved or cleared by the [place]  FDA and  has been authorized for detection and/or diagnosis of SARS-CoV-2 by FDA under an Emergency Use Authorization (EUA).  This EUA will remain in effect (meaning this test can be used) for the duration of  the COV                          ID-19 declaration under Section 564(b)(1) of the Act, 21 U.S.C. section 360bbb-3(b)(1), unless the authorization is terminated or revoked sooner.     SARS Coronavirus 2 by RT PCR 06/13/2022 NEGATIVE  NEGATIVE Final   Comment: (NOTE) SARS-CoV-2 target nucleic acids are NOT DETECTED.  The SARS-CoV-2 RNA is generally detectable in upper respiratory specimens during the acute phase of infection. The lowest concentration of SARS-CoV-2 viral copies this assay can detect is 138 copies/mL. A negative result does not preclude SARS-Cov-2 infection and should not be used as the sole basis for treatment or other patient management decisions. A negative result may occur with  improper specimen collection/handling, submission of specimen other than nasopharyngeal swab, presence of viral mutation(s) within the areas targeted by this assay, and inadequate number of viral copies(<138 copies/mL). A negative result must be combined with clinical observations, patient history, and epidemiological information. The expected result is Negative.  Fact Sheet for Patients:  BloggerCourse.com  Fact Sheet for Healthcare Providers:  SeriousBroker.it  This test is no                          t yet approved or cleared by the [place] FDA and  has been authorized for detection and/or diagnosis of SARS-CoV-2 by FDA under an Emergency Use Authorization (EUA). This EUA will remain  in effect (meaning this test can be used) for the duration of the COVID-19 declaration under Section 564(b)(1) of the Act, 21 U.S.C.section 360bbb-3(b)(1), unless the authorization is terminated  or revoked sooner.       Influenza  A by  PCR 06/13/2022 NEGATIVE  NEGATIVE Final   Influenza B by PCR 06/13/2022 NEGATIVE  NEGATIVE Final   Comment: (NOTE) The Xpert Xpress SARS-CoV-2/FLU/RSV plus assay is intended as an aid in the diagnosis of influenza from Nasopharyngeal swab specimens and should not be used as a sole basis for treatment. Nasal washings and aspirates are unacceptable for Xpert Xpress SARS-CoV-2/FLU/RSV testing.  Fact Sheet for Patients: BloggerCourse.com  Fact Sheet for Healthcare Providers: SeriousBroker.it  This test is not yet approved or cleared by the [place] FDA and has been authorized for detection and/or diagnosis of SARS-CoV-2 by FDA under an Emergency Use Authorization (EUA). This EUA will remain in effect (meaning this test can be used) for the duration of the COVID-19 declaration under Section 564(b)(1) of the Act, 21 U.S.C. section 360bbb-3(b)(1), unless the authorization is terminated or revoked.  Performed at [facility], [address], [place], [place] [address]    WBC 06/13/2022 7.5  4.0 - 10.5 K/uL Final   RBC 06/13/2022 4.64  4.22 - 5.81 MIL/uL Final   Hemoglobin 06/13/2022 14.2  13.0 - 17.0 g/dL Final   HCT 51/08/5850 42.5  39.0 - 52.0 % Final   MCV 06/13/2022 91.6  80.0 - 100.0 fL Final   MCH 06/13/2022 30.6  26.0 - 34.0 pg Final   MCHC 06/13/2022 33.4  30.0 - 36.0 g/dL Final   RDW 77/82/4235 13.0  11.5 - 15.5 % Final   Platelets 06/13/2022 208  150 - 400 K/uL Final   REPEATED TO VERIFY   nRBC 06/13/2022 0.0  0.0 - 0.2 % Final   Neutrophils Relative % 06/13/2022 69  % Final   Neutro Abs 06/13/2022 5.2  1.7 - 7.7 K/uL Final   Lymphocytes Relative 06/13/2022 22  % Final   Lymphs Abs 06/13/2022 1.7  0.7 - 4.0 K/uL Final   Monocytes Relative 06/13/2022 7  % Final   Monocytes Absolute 06/13/2022 0.5  0.1 - 1.0 K/uL Final   Eosinophils Relative 06/13/2022 1  % Final   Eosinophils Absolute 06/13/2022 0.1  0.0 - 0.5  K/uL Final   Basophils Relative 06/13/2022 1  % Final   Basophils Absolute 06/13/2022 0.1  0.0 - 0.1 K/uL Final   Immature Granulocytes 06/13/2022 0  % Final   Abs Immature Granulocytes 06/13/2022 0.01  0.00 - 0.07 K/uL Final   Performed at [facility], [address] [place], [place] [address]   Sodium 06/13/2022 139  135 - 145 mmol/L Final   Potassium 06/13/2022 4.1  3.5 - 5.1 mmol/L Final   Chloride 06/13/2022 104  98 - 111 mmol/L Final   CO2 06/13/2022 27  22 - 32 mmol/L Final   Glucose, Bld 06/13/2022 75  70 - 99 mg/dL Final   Glucose reference range applies only to samples taken after fasting for at least 8 hours.   BUN 06/13/2022 14  6 - 20 mg/dL Final   Creatinine, Ser 06/13/2022 0.95  0.61 - 1.24 mg/dL Final   Calcium 31/54/0086 9.0  8.9 - 10.3 mg/dL Final   Total Protein 76/19/5093 6.7  6.5 - 8.1 g/dL Final   Albumin 26/71/2458 4.2  3.5 - 5.0 g/dL Final   AST 09/98/3382 17  15 - 41 U/L Final   ALT 06/13/2022 22  0 - 44 U/L Final   Alkaline Phosphatase 06/13/2022 49  38 - 126 U/L Final   Total Bilirubin 06/13/2022 0.7  0.3 - 1.2 mg/dL Final   GFR,  Estimated 06/13/2022 >60  >60 mL/min Final   Comment: (NOTE) Calculated using the CKD-EPI Creatinine Equation (2021)    Anion gap 06/13/2022 8  5 - 15 Final   Performed at [facility], [address] [place], [place] [address]   Hgb A1c MFr Bld 06/13/2022 5.2  4.8 - 5.6 % Final   Comment: (NOTE)         Prediabetes: 5.7 - 6.4         Diabetes: >6.4         Glycemic control for adults with diabetes: <7.0    Mean Plasma Glucose 06/13/2022 103  mg/dL Final   Comment: (NOTE) Performed At: [facility] [address] [place], [place] [id number] Jolene Schimke MD XB:[id number]    Alcohol, Ethyl (B) 06/13/2022 <10  <10 mg/dL Final   Comment: (NOTE) Lowest detectable limit for serum alcohol is 10 mg/dL.  For medical purposes only. Performed at [facility], [address] [place],  [place] [address]    POC Amphetamine UR 06/13/2022 None Detected  NONE DETECTED (Cut Off Level 1000 ng/mL) Final   POC Secobarbital (BAR) 06/13/2022 None Detected  NONE DETECTED (Cut Off Level 300 ng/mL) Final   POC Buprenorphine (BUP) 06/13/2022 None Detected  NONE DETECTED (Cut Off Level 10 ng/mL) Final   POC Oxazepam (BZO) 06/13/2022 None Detected  NONE DETECTED (Cut Off Level 300 ng/mL) Final   POC Cocaine UR 06/13/2022 None Detected  NONE DETECTED (Cut Off Level 300 ng/mL) Final   POC Methamphetamine UR 06/13/2022 None Detected  NONE DETECTED (Cut Off Level 1000 ng/mL) Final   POC Morphine 06/13/2022 None Detected  NONE DETECTED (Cut Off Level 300 ng/mL) Final   POC Methadone UR 06/13/2022 None Detected  NONE DETECTED (Cut Off Level 300 ng/mL) Final   POC Oxycodone UR 06/13/2022 None Detected  NONE DETECTED (Cut Off Level 100 ng/mL) Final   POC Marijuana UR 06/13/2022 Positive (A)  NONE DETECTED (Cut Off Level 50 ng/mL) Final   Cholesterol 06/13/2022 147  0 - 200 mg/dL Final   Triglycerides 78/46/9629 83  <150 mg/dL Final   HDL 52/84/1324 49  >40 mg/dL Final   Total CHOL/HDL Ratio 06/13/2022 3.0  RATIO Final   VLDL 06/13/2022 17  0 - 40 mg/dL Final   LDL Cholesterol 06/13/2022 81  0 - 99 mg/dL Final   Comment:        Total Cholesterol/HDL:CHD Risk Coronary Heart Disease Risk Table                     Men   Women  1/2 Average Risk   3.4   3.3  Average Risk       5.0   4.4  2 X Average Risk   9.6   7.1  3 X Average Risk  23.4   11.0        Use the calculated Patient Ratio above and the CHD Risk Table to determine the patient's CHD Risk.        ATP III CLASSIFICATION (LDL):  <100     mg/dL   Optimal  401-027  mg/dL   Near or Above                    Optimal  130-159  mg/dL   Borderline  253-664  mg/dL   High  >403     mg/dL   Very High Performed at [facility], [address], [place], [place] [address]    TSH 06/13/2022 2.793  0.350 - 4.500 uIU/mL Final   Comment: Performed  by a 3rd Generation assay with a functional sensitivity of <=0.01 uIU/mL. Performed at [facility], [address], [place], [place] [address]    SARSCOV2ONAVIRUS 2 AG 06/15/2022 NEGATIVE  NEGATIVE Final   Comment: (NOTE) SARS-CoV-2 antigen NOT DETECTED.   Negative results are presumptive.  Negative results do not preclude SARS-CoV-2 infection and should not be used as the sole basis for treatment or other patient management decisions, including infection  control decisions, particularly in the presence of clinical signs and  symptoms consistent with COVID-19, or in those who have been in contact with the virus.  Negative results must be combined with clinical observations, patient history, and epidemiological information. The expected result is Negative.  Fact Sheet for Patients: https://www.jennings-kim.com/  Fact Sheet for Healthcare Providers: https://alexander-rogers.biz/  This test is not yet approved or cleared by the [place] FDA and  has been authorized for detection and/or diagnosis of SARS-CoV-2 by FDA under an Emergency Use Authorization (EUA).  This EUA will remain in effect (meaning this test can be used) for the duration of  the COV                          ID-19 declaration under Section 564(b)(1) of the Act, 21 U.S.C. section 360bbb-3(b)(1), unless the authorization is terminated or revoked sooner.      Allergies: Patient has no known allergies.  PTA Medications: (Not in a hospital admission)   Medical Decision Making  Overnight Observation  Restarted Risperdal 1 mg po daily and 2 mg po QHS.  Agitation orders was places due to reported bazaar behavior    Recommendations  Based on my evaluation the patient does not appear to have an emergency medical condition.  Oneta Rack, NP 06/26/22  11:35 AM

## 2022-06-26 NOTE — ED Notes (Signed)
Patient restless. Patient making repetitive movements by going back and forth to bathroom. Patient requested snack but denied food upon arrival. Patient is safe on unit with continued monitoring.

## 2022-06-26 NOTE — Progress Notes (Signed)
   06/26/22 1137  [facility] Triage Screening (Walk-ins at [facility] only)  How Did You Hear About [place]? Other (Comment) (Security at [employer] police)  What Is the Reason for Your Visit/Call Today? Pt is a 31 yo male who presented voluntarily and unaccompanied after a security guard at [employer] called police per pt. Pt could not explain why police were called. Pt denied any previous diagnoses but stated that he had just been [facility]arg[facility] about 2 days ago after a stay of about a week. Pt stated that his medications that were prescribed for him to take after discharge "made me so I can't feel my immune system." Pt stated that he stopped taking his meeications soon after discharge. Pt denied seeing an OP therapist. Pt stated that not being able to feel his immune system made him feel suicidal. Pt denied any specific plan or true intent. Pt denied HI, NSSH, AVH, paranoia and any substance use. Pt stated he lives with his parents. Pt stated he is sleeping and eating normally.  How Long Has This Been Causing You Problems? > than 6 months  Have You Recently Had Any Thoughts About Hurting Yourself? Yes  How long ago did you have thoughts about hurting yourself? earlier today  Are You Planning to Commit Suicide/Harm Yourself At This time? No  Have you Recently Had Thoughts About Hurting Someone Karolee Ohs? No  Are You Planning To Harm Someone At This Time? No  Are you currently experiencing any auditory, visual or other hallucinations? No  Have You Used Any Alcohol or Drugs in the Past 24 Hours? No  Do you have any current medical co-morbidities that require immediate attention? No (none reported)  Clinician description of patient physical appearance/behavior: Pt is calm, cooperative, alert and seems oriented x 5. Pt's speech and momvement seem normal. Pt's mood seems euthymic and he denies any sad feelings but his flat affect seems incongruent. Pt's judgment and insight seemed impaired.  What Do You Feel  Would Help You the Most Today? Treatment for Depression or other mood problem  If access to [facility] was not available, would you have sought care in the Emergency Department? Yes  Determination of Need Urgent (48 hours) (Per NP Hillery Jacks pt is recommended for observation at the [facility].)  Options For Referral [facility]   Theora Gianotti. Jimmye Norman, MS, [facility], [facility] Triage Specialist [facility]

## 2022-06-27 NOTE — Progress Notes (Signed)
CSW spoke with Nathan Mayo with [facility]. Nathan Mayo requested a CBC and alcohol level be faxed once completed for further review.   [name], MSW, Lenice Pressman Phone: [phone] Disposition/TOC

## 2022-06-27 NOTE — ED Provider Notes (Signed)
  Noted per CSW: "Per [name] [facility], pt has been accepted to [facility] [place] unit. Accepting provider is Dr. Guss Bunde. Patient can arrive anytime. Number for report is [phone]."  -NP will initiate involuntary commitment  (IVC) due to mood lability and patient responding to internal stimuli.

## 2022-06-27 NOTE — Progress Notes (Signed)
Pt now under complete IVC. Three copies made for record.

## 2022-06-27 NOTE — ED Notes (Signed)
Pt under IVC, awake & resting at present, no distress noted. Calm & cooperative.  Pending [facility] in am.  Monitoring for safety.

## 2022-06-27 NOTE — Progress Notes (Signed)
Per [name] [facility], pt has been accepted to [facility] [place] unit. Accepting provider is Dr. Guss Bunde. Patient can arrive anytime. Number for report is [phone].   [name], MSW, LCSW-A Phone: [phone] Disposition/TOC

## 2022-06-27 NOTE — ED Notes (Signed)
[employer] has been called to request service on signing findings and custody order for this patient who has now been made IVC. Dispatch received .  Writer also called sheriff for transport and message left as [place] of county once paperwork served. IVC

## 2022-06-27 NOTE — Progress Notes (Signed)
[name] with [facility] advised that patient is under further review.    [name], MSW, Lenice Pressman Phone: [phone] Disposition/TOC

## 2022-06-27 NOTE — ED Provider Notes (Signed)
FBC/OBS ASAP Discharge Summary  Date and Time: 06/27/2022 3:46 PM  Name: Nathan Mayo  MRN:  [id number]   Discharge Diagnoses:  Final diagnoses:  Unspecified mood (affective) disorder [facility])    Patient has been accepted to [facility]  under IVC. Attending Dr. Guss Bunde   Stay Summary: Per HPI: " Nathan Mayo 31 year old Asian male presents to [facility] urgent care reporting " these medications is making me sick."  States he was recently discharged from inpatient admission and was initiated on Haldol and feels that the Haldol is attacking his immune system.  Reports he has not been taking medications as indicated.  Reports feeling suicidal denying plan or intent.  He denies auditory or visual hallucinations.  Patient reports he resides with his parents.  Denies that he is currently employed. Adon has a charted history with cannabis induced psychotic disorder, generalized anxiety disorder, unspecified mood disorder and undifferentiated schizophrenia.  Recently discharged on Haldol 5 mg p.o. twice daily, Cogentin 1 mg bid and Effexor 37.5 mg daily, Risperdal 1 mg daily and 2 mg nightly.  Denies that he has been compliant since discharge on 12/14."   Total Time spent with patient: 15 minutes  Past Psychiatric History:  Past Medical History: No past medical history on file. No past surgical history on file. Family History: No family history on file. Family Psychiatric History:  Social History:  Social History   Substance and Sexual Activity  Alcohol Use No     Social History   Substance and Sexual Activity  Drug Use No    Social History   Socioeconomic History   Marital status: Single    Spouse name: Not on file   Number of children: Not on file   Years of education: Not on file   Highest education level: Not on file  Occupational History   Not on file  Tobacco Use   Smoking status: Never   Smokeless tobacco: Never  Vaping Use   Vaping Use: Every day  Substance and Sexual  Activity   Alcohol use: No   Drug use: No   Sexual activity: Never  Other Topics Concern   Not on file  Social History Narrative   Not on file   Social Determinants of Health   Financial Resource Strain: Not on file  Food Insecurity: No Food Insecurity (06/15/2022)   Hunger Vital Sign    Worried About Running Out of Food in the Last Year: Never true    Ran Out of Food in the Last Year: Never true  Transportation Needs: No Transportation Needs (06/15/2022)   PRAPARE - Administrator, Civil Service (Medical): No    Lack of Transportation (Non-Medical): No  Physical Activity: Not on file  Stress: Not on file  Social Connections: Not on file   SDOH:  SDOH Screenings   Food Insecurity: No Food Insecurity (06/15/2022)  Housing: Low Risk  (06/15/2022)  Transportation Needs: No Transportation Needs (06/15/2022)  Utilities: Not At Risk (06/15/2022)  Alcohol Screen: Low Risk  (06/15/2022)  Depression (PHQ2-9): Low Risk  (06/26/2022)  Tobacco Use: Low Risk  (06/15/2022)    Tobacco Cessation:  N/A, patient does not currently use tobacco products  Current Medications:  Current Facility-Administered Medications  Medication Dose Route Frequency Provider Last Rate Last Admin   acetaminophen (TYLENOL) tablet 650 mg  650 mg Oral Q6H PRN Oneta Rack, NP       alum & mag hydroxide-simeth (MAALOX/MYLANTA) 200-200-20 MG/5ML suspension 30 mL  30  mL Oral Q4H PRN Oneta Rack, NP       diphenhydrAMINE (BENADRYL) injection 50 mg  50 mg Intravenous Once Rena Sweeden, Jerene Pitch, NP       haloperidol (HALDOL) tablet 5 mg  5 mg Oral Once Oneta Rack, NP       LORazepam (ATIVAN) tablet 2 mg  2 mg Oral BID Oneta Rack, NP       Or   LORazepam (ATIVAN) injection 2 mg  2 mg Intramuscular BID Oneta Rack, NP       magnesium hydroxide (MILK OF MAGNESIA) suspension 30 mL  30 mL Oral Daily PRN Oneta Rack, NP       risperiDONE (RISPERDAL M-TABS) disintegrating tablet 1 mg  1 mg Oral BID  Oneta Rack, NP       traZODone (DESYREL) tablet 50 mg  50 mg Oral QHS PRN Oneta Rack, NP   50 mg at 06/26/22 2113   venlafaxine (EFFEXOR) tablet 37.5 mg  37.5 mg Oral Once Oneta Rack, NP       No current outpatient medications on file.    PTA Medications: (Not in a hospital admission)      06/26/2022   11:56 AM 11/29/2021    1:11 AM  Depression screen PHQ 2/9  Decreased Interest 0 1  Down, Depressed, Hopeless 0 0  PHQ - 2 Score 0 1  Altered sleeping  0  Tired, decreased energy  0  Change in appetite  0  Feeling bad or failure about yourself   0  Trouble concentrating  1  Moving slowly or fidgety/restless  0  Suicidal thoughts  0  PHQ-9 Score  2  Difficult doing work/chores  Not difficult at all    Flowsheet Row ED from 06/26/2022 in [facility]Dis[facility] from 06[facility]13/2022 in [facility]  C-SSRS RISK CATEGORY No Risk No Risk No Risk       Musculoskeletal  Strength & Muscle Tone: within normal limits Gait & Station: normal Patient leans: N/A  Psychiatric Specialty Exam  Presentation  General Appearance:  Disheveled  Eye Contact: Good  Speech: Clear and Coherent  Speech Volume: Normal  Handedness: Right   Mood and Affect  Mood: Euthymic  Affect: Appropriate   Thought Process  Thought Processes: Coherent  Descriptions of Associations:Intact  Orientation:Full (Time, Place and Person)  Thought Content:Logical  Diagnosis of Schizophrenia or Schizoaffective disorder in past: No (none noted)  Duration of Psychotic Symptoms: Greater than six months   Hallucinations:No data recorded Ideas of Reference:None  Suicidal Thoughts:No data recorded Homicidal Thoughts:No data recorded  Sensorium  Memory: Immediate Good  Judgment: Fair  Insight: Fair   Art therapist   Concentration: Fair  Attention Span: Fair  Recall: Fair  Fund of Knowledge: Fair  Language: Fair   Psychomotor Activity  Psychomotor Activity:No data recorded  Assets  Assets: Resilience   Sleep  Sleep:No data recorded  No data recorded  Physical Exam  Physical Exam Vitals and nursing note reviewed.  Constitutional:      Appearance: Normal appearance.  Cardiovascular:     Rate and Rhythm: Normal rate and regular rhythm.  Neurological:     Mental Status: He is alert.  Psychiatric:        Mood and Affect: Mood normal.        Behavior: Behavior normal.  Thought Content: Thought content normal.    Review of Systems  Cardiovascular: Negative.   Genitourinary: Negative.   Psychiatric/Behavioral:  Positive for hallucinations. The patient is nervous/anxious.   All other systems reviewed and are negative.  Blood pressure 104/66, pulse (!) 54, temperature 98.4 F (36.9 C), temperature source Oral, resp. rate 16, height 5\' 9"  (1.753 m), weight 130 lb (59 kg), SpO2 95 %. Body mass index is 19.2 kg/m.  Demographic Factors:  Male  Loss Factors: NA  Historical Factors: Family history of mental illness or substance abuse and Impulsivity  Risk Reduction Factors:   NA  Continued Clinical Symptoms:  Severe Anxiety and/or Agitation Schizophrenia:   Command hallucinatons Paranoid or undifferentiated type  Cognitive Features That Contribute To Risk:  Closed-mindedness    Suicide Risk:  Minimal: No identifiable suicidal ideation.  Patients presenting with no risk factors but with morbid ruminations; may be classified as minimal risk based on the severity of the depressive symptoms  Plan Of Care/Follow-up recommendations:  Activity:  as tolerated  Disposition: Per communication with CSW: -[facility], pt has been accepted to [facility] [place] unit. Accepting provider is Dr. [place]. Patient can arrive anytime. Number for report is [phone].   [phone]  [phone], NP 06/27/2022, 3:46 PM

## 2022-06-27 NOTE — Progress Notes (Signed)
Per Tanika Lewis, NP, patient meets criteria for inpatient treatment. There are no available beds at [facility] today. CSW faxed referrals to the following facilities for review:  [facility]  Pending - Request Sent N/A [address] [place] [address] [phone] --  [facility]  Pending - Request Sent N/A [address] [place] [address] [phone] --  [facility]  Pending - Request Sent N/A [address] [place] [address] [phone] --  [facility]  Pending - Request Sent N/A [address] [place] [address] [phone] --  [facility]  Pending - Request Sent N/A [address], [place] [address] [phone] --  [facility]  Pending - Request Sent N/A [address], [place] [address] [phone] --  [facility]  Pending - Request Sent N/A [address] [place] [address] [phone] --  [facility]  Pending - Request Sent N/A [address] [place] [address] [phone] --  [facility]  Pending - Request Sent N/A [address] [place] [address] [phone] --  [facility]  Pending - Request Sent N/A [address] [place] [address] [phone] --  [facility]  Pending - Request Sent N/A [address], [place] [address] [phone] --  [facility]  Pending - Request Sent N/A [address], [place] [address] [phone] --  [facility]  Pending - Request Sent N/A [address] [place] [address] [phone] --  [facility]  Pending - Request Sent N/A [address], [place] [address] [phone] --  [facility]  Pending - Request Sent N/A [address], [place] [address] [phone] --  [facility]  Pending - Request Sent N/A [address], [place] [address] [phone] --   TTS will continue to seek bed placement.  Aysiah Jurado, MSW, LCSW-A, LCAS Phone: [phone] Disposition/TOC  

## 2022-06-27 NOTE — ED Notes (Signed)
Pt resting at present, no distress noted.  Monitoring for safety. 

## 2022-06-27 NOTE — Progress Notes (Signed)
Fax sent to [phone] of pt IVC paperwork

## 2022-06-28 NOTE — ED Notes (Addendum)
Sheriffs Transport requested.  Pt accepted to [facility] [place] [facility].  Dr Challa/Accepting MD.  Call Report to [phone].  Pt is IVC.

## 2022-06-28 NOTE — ED Notes (Signed)
Refused Breakfast  

## 2022-06-28 NOTE — ED Notes (Signed)
Pt sleeping at present, no distress noted.  Monitoring for safety. 

## 2022-06-28 NOTE — Progress Notes (Signed)
Report called and given to [facility]  at [facility].

## 2022-06-28 NOTE — ED Notes (Signed)
Pt awake at present, no distress noted.  Monitoring for safety. 

## 2022-06-28 NOTE — Progress Notes (Signed)
Received Nathan Mayo this AM awake sitting on his chair bed. He refused breakfast and his medications this AM. He was informed of his impending transferred to [facility] and cooperative with the information. The sheriff arrived at 0958 hrs. He received his personal belongings and the appropriate    paperwork was transported with him without incident.
